# Patient Record
Sex: Female | Born: 1940 | Race: White | Hispanic: No | Smoking: Never smoker
Health system: Southern US, Community
[De-identification: ages and names within clinical notes are randomized; demographics above are authoritative.]

## PROBLEM LIST (undated history)

## (undated) DIAGNOSIS — F32A Depression, unspecified: Secondary | ICD-10-CM

## (undated) DIAGNOSIS — M1990 Unspecified osteoarthritis, unspecified site: Secondary | ICD-10-CM

## (undated) DIAGNOSIS — I10 Essential (primary) hypertension: Secondary | ICD-10-CM

## (undated) DIAGNOSIS — T7840XA Allergy, unspecified, initial encounter: Secondary | ICD-10-CM

## (undated) DIAGNOSIS — F329 Major depressive disorder, single episode, unspecified: Secondary | ICD-10-CM

## (undated) HISTORY — DX: Depression, unspecified: F32.A

## (undated) HISTORY — DX: Unspecified osteoarthritis, unspecified site: M19.90

## (undated) HISTORY — DX: Allergy, unspecified, initial encounter: T78.40XA

## (undated) HISTORY — DX: Essential (primary) hypertension: I10

## (undated) HISTORY — DX: Major depressive disorder, single episode, unspecified: F32.9

---

## 1999-07-25 ENCOUNTER — Encounter: Admission: RE | Admit: 1999-07-25 | Discharge: 1999-07-25 | Payer: Self-pay | Admitting: Emergency Medicine

## 1999-07-25 ENCOUNTER — Encounter: Payer: Self-pay | Admitting: Emergency Medicine

## 2000-07-26 ENCOUNTER — Encounter: Payer: Self-pay | Admitting: *Deleted

## 2000-07-26 ENCOUNTER — Encounter: Admission: RE | Admit: 2000-07-26 | Discharge: 2000-07-26 | Payer: Self-pay | Admitting: *Deleted

## 2000-12-11 ENCOUNTER — Encounter: Payer: Self-pay | Admitting: *Deleted

## 2000-12-11 ENCOUNTER — Encounter: Admission: RE | Admit: 2000-12-11 | Discharge: 2000-12-11 | Payer: Self-pay | Admitting: *Deleted

## 2001-07-30 ENCOUNTER — Encounter: Payer: Self-pay | Admitting: *Deleted

## 2001-07-30 ENCOUNTER — Encounter: Admission: RE | Admit: 2001-07-30 | Discharge: 2001-07-30 | Payer: Self-pay | Admitting: *Deleted

## 2001-11-29 ENCOUNTER — Other Ambulatory Visit: Admission: RE | Admit: 2001-11-29 | Discharge: 2001-11-29 | Payer: Self-pay | Admitting: *Deleted

## 2002-02-17 ENCOUNTER — Ambulatory Visit (HOSPITAL_COMMUNITY): Admission: RE | Admit: 2002-02-17 | Discharge: 2002-02-17 | Payer: Self-pay | Admitting: Gastroenterology

## 2002-08-14 ENCOUNTER — Encounter: Payer: Self-pay | Admitting: *Deleted

## 2002-08-14 ENCOUNTER — Encounter: Admission: RE | Admit: 2002-08-14 | Discharge: 2002-08-14 | Payer: Self-pay | Admitting: *Deleted

## 2002-12-03 ENCOUNTER — Other Ambulatory Visit: Admission: RE | Admit: 2002-12-03 | Discharge: 2002-12-03 | Payer: Self-pay | Admitting: *Deleted

## 2003-08-17 ENCOUNTER — Encounter: Admission: RE | Admit: 2003-08-17 | Discharge: 2003-08-17 | Payer: Self-pay | Admitting: *Deleted

## 2003-12-16 ENCOUNTER — Other Ambulatory Visit: Admission: RE | Admit: 2003-12-16 | Discharge: 2003-12-16 | Payer: Self-pay | Admitting: *Deleted

## 2005-01-16 ENCOUNTER — Encounter: Admission: RE | Admit: 2005-01-16 | Discharge: 2005-01-16 | Payer: Self-pay | Admitting: Emergency Medicine

## 2005-04-18 ENCOUNTER — Ambulatory Visit (HOSPITAL_BASED_OUTPATIENT_CLINIC_OR_DEPARTMENT_OTHER): Admission: RE | Admit: 2005-04-18 | Discharge: 2005-04-18 | Payer: Self-pay | Admitting: Orthopaedic Surgery

## 2005-04-18 ENCOUNTER — Ambulatory Visit (HOSPITAL_COMMUNITY): Admission: RE | Admit: 2005-04-18 | Discharge: 2005-04-18 | Payer: Self-pay | Admitting: Orthopaedic Surgery

## 2005-08-31 ENCOUNTER — Encounter: Admission: RE | Admit: 2005-08-31 | Discharge: 2005-08-31 | Payer: Self-pay | Admitting: Emergency Medicine

## 2006-08-28 ENCOUNTER — Encounter: Admission: RE | Admit: 2006-08-28 | Discharge: 2006-08-28 | Payer: Self-pay | Admitting: Emergency Medicine

## 2006-09-03 ENCOUNTER — Encounter: Admission: RE | Admit: 2006-09-03 | Discharge: 2006-09-03 | Payer: Self-pay | Admitting: Emergency Medicine

## 2007-10-16 ENCOUNTER — Encounter: Admission: RE | Admit: 2007-10-16 | Discharge: 2007-10-16 | Payer: Self-pay | Admitting: Emergency Medicine

## 2008-09-11 HISTORY — PX: REPLACEMENT TOTAL KNEE: SUR1224

## 2008-10-26 ENCOUNTER — Encounter: Admission: RE | Admit: 2008-10-26 | Discharge: 2008-10-26 | Payer: Self-pay | Admitting: Emergency Medicine

## 2009-02-11 ENCOUNTER — Inpatient Hospital Stay (HOSPITAL_COMMUNITY): Admission: RE | Admit: 2009-02-11 | Discharge: 2009-02-15 | Payer: Self-pay | Admitting: Orthopaedic Surgery

## 2009-10-27 ENCOUNTER — Encounter: Admission: RE | Admit: 2009-10-27 | Discharge: 2009-10-27 | Payer: Self-pay | Admitting: Emergency Medicine

## 2010-04-27 ENCOUNTER — Encounter: Admission: RE | Admit: 2010-04-27 | Discharge: 2010-04-27 | Payer: Self-pay | Admitting: Family Medicine

## 2010-08-11 ENCOUNTER — Ambulatory Visit: Payer: Self-pay | Admitting: Family Medicine

## 2010-08-11 DIAGNOSIS — I10 Essential (primary) hypertension: Secondary | ICD-10-CM | POA: Insufficient documentation

## 2010-10-01 ENCOUNTER — Other Ambulatory Visit: Payer: Self-pay | Admitting: Family Medicine

## 2010-10-01 DIAGNOSIS — Z1239 Encounter for other screening for malignant neoplasm of breast: Secondary | ICD-10-CM

## 2010-10-13 NOTE — Assessment & Plan Note (Signed)
Summary: COLD AND CONGESTION/EVM   Vital Signs:  Patient Profile:   70 Years Old Female CC:      Cold & URI symptoms Height:     61 inches Weight:      156 pounds BMI:     29.58 O2 Sat:      96 % O2 treatment:    Room Air Temp:     97.6 degrees F oral Pulse rate:   70 / minute Pulse rhythm:   regular Resp:     18 per minute BP sitting:   145 / 77  (right arm)  Pt. in pain?   no  Vitals Entered By: Levonne Spiller EMT-P (August 11, 2010 12:54 PM)              Is Patient Diabetic? No     Serial Vital Signs/Assessments:  Time      Position  BP       Pulse  Resp  Temp     By 1309                132/79   69                    Clanford Johnson MD   Current Allergies: No known allergies History of Present Illness History from: patient Chief Complaint: Cold & URI symptoms History of Present Illness: Pt presenting today because she is having at least 1.5 weeks of nasal congestion, productive cough, yellowish sputum, no wheezing, no SOB, no CP, no diarrhea reported; ears plugged up; She has had some rare sneezing.  She says that she is taking her blood pressure medications but does report that the dose was decreased by 1/2 recently.  She says that she has been taking Nyquil OTC at night.  She says that she does not have constipation, but has had some fatigue.   Pt says that she already had her flu vaccine.   REVIEW OF SYSTEMS Constitutional Symptoms       Complains of fatigue.     Denies fever, chills, night sweats, weight loss, and weight gain.  Eyes       Denies change in vision, eye pain, eye discharge, glasses, contact lenses, and eye surgery. Ear/Nose/Throat/Mouth       Complains of frequent runny nose, sinus problems, and hoarseness.      Denies hearing loss/aids, change in hearing, ear pain, ear discharge, dizziness, frequent nose bleeds, sore throat, and tooth pain or bleeding.  Respiratory       Complains of productive cough.      Denies dry cough, wheezing, shortness  of breath, asthma, bronchitis, and emphysema/COPD.      Comments: Colored Sputum Cardiovascular       Denies murmurs, chest pain, and tires easily with exhertion.    Gastrointestinal       Denies stomach pain, nausea/vomiting, diarrhea, constipation, blood in bowel movements, and indigestion. Genitourniary       Denies painful urination, kidney stones, and loss of urinary control. Neurological       Denies paralysis, seizures, and fainting/blackouts. Musculoskeletal       Denies muscle pain, joint pain, joint stiffness, decreased range of motion, redness, swelling, muscle weakness, and gout.  Skin       Denies bruising, unusual mles/lumps or sores, and hair/skin or nail changes.  Psych       Denies mood changes, temper/anger issues, anxiety/stress, speech problems, depression, and sleep problems.  Past History:  Past  Medical History: Hypertension Allergic Rhinitis - Seasonal   Past Surgical History: Pt denies  Family History: Heart Disease in both parents  Social History: Pt denies tobacco, alcohol and recreational drug use. Physical Exam General appearance: well developed, well nourished, no acute distress Head: normocephalic, atraumatic Eyes: conjunctivae and lids normal Pupils: equal, round, reactive to light Ears: normal, no lesions or deformities Nasal: thick yellowish drainage seen in left nares Oral/Pharynx: tongue normal, posterior pharynx without erythema or exudate Neck: neck supple,  trachea midline, no masses Thyroid: no nodules, masses, tenderness, or enlargement Chest/Lungs: no rales, wheezes, or rhonchi bilateral, breath sounds equal without effort Heart: regular rate and  rhythm, no murmur Abdomen: soft, non-tender without obvious organomegaly Extremities: normal extremities Neurological: grossly intact and non-focal Skin: no obvious rashes or lesions MSE: oriented to time, place, and person Assessment Problems:   New Problems: UNSPECIFIED ESSENTIAL  HYPERTENSION (ICD-401.9) ALLERGIC RHINITIS, SEASONAL, MILD (ICD-477.9) ACUTE SINUSITIS, UNSPECIFIED (ICD-461.9)   Patient Education: Patient and/or caregiver instructed in the following: rest, fluids, Tylenol prn. The risks, benefits and possible side effects were clearly explained and discussed with the patient.  The patient verbalized clear understanding.  The patient was given instructions to return if symptoms don't improve, worsen or new changes develop.  If it is not during clinic hours and the patient cannot get back to this clinic then the patient was told to seek medical care at an available urgent care or emergency department.  The patient verbalized understanding.   Demonstrates willingness to comply.  Plan New Medications/Changes: AMOXICILLIN 875 MG TABS (AMOXICILLIN) take 1 by mouth two times a day to treat sinus infection  #20 x 0, 08/11/2010, Standley Dakins MD  Follow Up: Follow up in 2-3 days if no improvement, Follow up on an as needed basis, Follow up with Primary Physician  The patient and/or caregiver has been counseled thoroughly with regard to medications prescribed including dosage, schedule, interactions, rationale for use, and possible side effects and they verbalize understanding.  Diagnoses and expected course of recovery discussed and will return if not improved as expected or if the condition worsens. Patient and/or caregiver verbalized understanding.  Prescriptions: AMOXICILLIN 875 MG TABS (AMOXICILLIN) take 1 by mouth two times a day to treat sinus infection  #20 x 0   Entered and Authorized by:   Standley Dakins MD   Signed by:   Standley Dakins MD on 08/11/2010   Method used:   Electronically to        [facility] (retail)       [address], [address]       [place], [place]  [address]       Ph: [phone]       Fax: [phone]   RxID:   [id number]   Patient Instructions: 1)  Take 650mg  of Tylenol  every 4-6 hours as needed for relief of pain or comfort of fever AVOID taking more than 4000mg   in a 24 hour period (can cause liver damage in higher doses). 2)  Take your antibiotic as prescribed until ALL of it is gone, but stop if you develop a rash or swelling and contact our office as soon as possible. 3)  Acute sinusitis symptoms for less than 10 days are not helped by antibiotics.Use warm moist compresses, and over the counter decongestants ( only as directed). Call if no improvement in 5-7 days, sooner if increasing pain, fever, or new symptoms.  4)  Oral Rehydration Solution: drink 1/2 ounce every 15 minutes. If tolerated afert 1 hour, drink 1 ounce every 15 minutes. As you can tolerate, keep adding 1/2 ounce every 15 minutes, up to a total of 2-4 ounces. Contact the office if unable to tolerate oral solution, if you keep vomiting, or you continue to have signs of dehydration. 5)  Go to your Primary provider and have your blood pressure rechecked in 1 week.  6)  Check your Blood Pressure regularly. If it is above 130/90 you should make an appointment.

## 2010-10-31 ENCOUNTER — Ambulatory Visit: Payer: Self-pay

## 2010-11-07 ENCOUNTER — Ambulatory Visit: Payer: Self-pay

## 2010-11-07 ENCOUNTER — Ambulatory Visit
Admission: RE | Admit: 2010-11-07 | Discharge: 2010-11-07 | Disposition: A | Discharge status: Home / Self Care | Payer: Medicare Other | Source: Ambulatory Visit | Attending: Family Medicine | Admitting: Family Medicine

## 2010-11-07 DIAGNOSIS — Z1239 Encounter for other screening for malignant neoplasm of breast: Secondary | ICD-10-CM

## 2010-12-19 LAB — PROTIME-INR
INR: 1.2 (ref 0.00–1.49)
INR: 1.9 — ABNORMAL HIGH (ref 0.00–1.49)
INR: 2.3 — ABNORMAL HIGH (ref 0.00–1.49)
Prothrombin Time: 23.3 seconds — ABNORMAL HIGH (ref 11.6–15.2)
Prothrombin Time: 26.9 seconds — ABNORMAL HIGH (ref 11.6–15.2)
Prothrombin Time: 32.1 seconds — ABNORMAL HIGH (ref 11.6–15.2)

## 2010-12-19 LAB — CBC
HCT: 28.9 % — ABNORMAL LOW (ref 36.0–46.0)
Hemoglobin: 10 g/dL — ABNORMAL LOW (ref 12.0–15.0)
Hemoglobin: 9.1 g/dL — ABNORMAL LOW (ref 12.0–15.0)
MCHC: 34.4 g/dL (ref 30.0–36.0)
MCHC: 34.6 g/dL (ref 30.0–36.0)
MCHC: 34.7 g/dL (ref 30.0–36.0)
MCV: 92.9 fL (ref 78.0–100.0)
Platelets: 173 10*3/uL (ref 150–400)
Platelets: 183 10*3/uL (ref 150–400)
RBC: 2.98 MIL/uL — ABNORMAL LOW (ref 3.87–5.11)
RBC: 3.09 MIL/uL — ABNORMAL LOW (ref 3.87–5.11)
RDW: 13 % (ref 11.5–15.5)
WBC: 4.9 10*3/uL (ref 4.0–10.5)

## 2010-12-19 LAB — BASIC METABOLIC PANEL
BUN: 10 mg/dL (ref 6–23)
BUN: 8 mg/dL (ref 6–23)
CO2: 31 mEq/L (ref 19–32)
CO2: 32 mEq/L (ref 19–32)
CO2: 34 mEq/L — ABNORMAL HIGH (ref 19–32)
Calcium: 8.5 mg/dL (ref 8.4–10.5)
Calcium: 8.8 mg/dL (ref 8.4–10.5)
Creatinine, Ser: 0.69 mg/dL (ref 0.4–1.2)
Creatinine, Ser: 0.73 mg/dL (ref 0.4–1.2)
GFR calc non Af Amer: >60 mL/min (ref >60)
Glucose, Bld: 100 mg/dL — ABNORMAL HIGH (ref 70–99)
Glucose, Bld: 122 mg/dL — ABNORMAL HIGH (ref 70–99)
Potassium: 3.5 mEq/L (ref 3.5–5.1)
Potassium: 3.9 mEq/L (ref 3.5–5.1)
Sodium: 135 mEq/L (ref 135–145)

## 2010-12-20 LAB — BASIC METABOLIC PANEL
BUN: 16 mg/dL (ref 6–23)
Chloride: 101 mEq/L (ref 96–112)
GFR calc Af Amer: >60 mL/min (ref >60)
Glucose, Bld: 97 mg/dL (ref 70–99)

## 2010-12-20 LAB — CBC
HCT: 37.3 % (ref 36.0–46.0)
Hemoglobin: 12.9 g/dL (ref 12.0–15.0)
MCHC: 34.7 g/dL (ref 30.0–36.0)
Platelets: 210 10*3/uL (ref 150–400)
RDW: 12.6 % (ref 11.5–15.5)

## 2011-01-24 NOTE — Discharge Summary (Signed)
Monica Fuller, Monica Fuller                 ACCOUNT NO.:  [id number]   MEDICAL RECORD NO.:  [id number]          PATIENT TYPE:  INP   LOCATION:  5011                         FACILITY:  [facility]   PHYSICIAN:  Lindwood Qua, P.A. DATE OF BIRTH:  1941-08-15   DATE OF ADMISSION:  02/11/2009  DATE OF DISCHARGE:  02/15/2009                               DISCHARGE SUMMARY   ADMITTING DIAGNOSES:  1. Left knee end-stage degenerative joint disease.  2. Hypertension.  3. Depression.   DISCHARGE DIAGNOSES:  1. Left knee end-stage  degenerative joint disease.  2. Hypertension.  3. Depression.   OPERATIONS:  Left total knee replacement.   BRIEF HISTORY:  Ms. Aguirre is a 70 year old white female patient well-  known to our practice who has had increasing left knee pain and on x-  ray, has end-stage DJD, medial and patellofemoral compartment.  We  discussed treatment options that being total knee replacement.   PERTINENT LABORATORY AND X-RAY FINDINGS:  Hemoglobin last testing 9.1,  hematocrit 26.3, WBC is 4.5, platelets 183.  Sodium 135, potassium 3.9,  BUN 8, glucose 122, creatinine 0.72.  Last INR 1.9.   COURSE IN THE HOSPITAL:  Ms. Steury was admitted postoperatively.  Kept  on her home medicines which will be outlined at the end of this  dictation, Lovenox and Coumadin, protocol for DVT prophylaxis per  pharmacy, IV Ancef 1 g q.8 h. x3 doses and then appropriate pain  medicine, given antiemetics, antispasmodics, knee-high TEDs, incentive  spirometry, and then activity, weightbearing as tolerated with physical  therapy were ordered.  The first day postop, she was using her CPM.  Foley catheter was in place.  Blood pressure was 90/52.  Temperature was  99.  Lungs were clear.  Abdomen was soft.  Catheter was removed.  She  was able to void without difficulty.  Second day postop, dressing was  changed and drain was pulled.  Wound was noted to be benign.  No sign of  infection or irritation.  Lungs  continued to be clear throughout her  hospital stay without any difficulties.  The patient eventually had  small bowel movement on postoperative days, was able to void well.  Due  to her family situation and home life, she had no one at home to care  for her and it was necessary to discharge to the skilled nursing  facility for short term.   CONDITION ON DISCHARGE:  Improved.   FOLLOWUP:  She will remain on a low-sodium, heart-healthy diet.  May  change her dressing daily.  Weightbearing as tolerated.  Physical  therapy for range of motion, and may also incorporate a CPM.  Knee brace  only as needed.  She would be on Coumadin with an INR between 2-3 for 2  weeks postoperatively regulated by pharmacy.  Continue on Percocet 1 or  2 every 4-6 hours p.r.n. pain.  Coumadin as prescribed.  Continue on her  home medicines which  would be calcium 600 mg with vitamin D 1 or 2 per day and triamterene  and hydrochlorothiazide 37.5/25 1 a day, sertraline 100 mg  1 a day.  If  any sign of infection, to call our office at [phone] immediately,  otherwise we will see her back in the office in 2 weeks from the  surgical day so that staples can be removed.      Lindwood Qua, P.A.     MC/MEDQ  D:  02/14/2009  T:  02/15/2009  Job:  [id number]

## 2011-01-24 NOTE — Op Note (Signed)
Monica Fuller, BRESEE                 ACCOUNT NO.:  [id number]   MEDICAL RECORD NO.:  [id number]          PATIENT TYPE:  INP   LOCATION:  5011                         FACILITY:  [facility]   PHYSICIAN:  Lubertha Basque. Dalldorf, M.D.DATE OF BIRTH:  [birth year]   DATE OF PROCEDURE:  02/11/2009  DATE OF DISCHARGE:                               OPERATIVE REPORT   PREOPERATIVE DIAGNOSIS:  Left knee degenerative joint disease.   POSTOPERATIVE DIAGNOSIS:  Left knee degenerative joint disease.   PROCEDURE:  Left total knee replacement.   ANESTHESIA:  General and block.   ATTENDING SURGEON:  Lubertha Basque. Jerl Santos, MD   ASSISTANT:  Lindwood Qua, PA   INDICATIONS FOR PROCEDURE:  The patient is a 70 year old woman with a  long history of left knee pain.  She has had various oral anti-  inflammatories and multiple injections.  She had an arthroscopy about 5  years back.  She has pain at this point which is limiting to her in that  she cannot rest or walk without difficulty.  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  She is offered a  knee replacement.  Informed operative consent was obtained after  discussion of possible complications including reaction to anesthesia,  infection, DVT, PE, and death.   SUMMARY, FINDINGS, AND PROCEDURE:  Under general anesthesia and a block,  a left knee replacement was performed.  She had advanced degenerative  change in medial and patellofemoral compartments.  She had good bone  quality.  I addressed her problem with a cemented DePuy LCS system using  a standard plus femur, 4 tray, 38 all polyethylene patella, and 12.5  deep dish spacer.  Lindwood Qua assisted throughout and was  invaluable to the completion of the case in that he helped position and  retract while I performed the procedure.  He also closed simultaneously  to help minimize OR time.   DESCRIPTION OF PROCEDURE:  The patient was taken to the operating suite  where  general anesthetic was applied without difficulty.  She was also  given a block in the preanesthesia area.  She was positioned supine and  prepped and draped in a normal sterile fashion.  After administration of  IV Kefzol, the left leg was elevated, exsanguinated, tourniquet was  inflated about the thigh.  A longitudinal anterior incision was made  with dissection down the extensor mechanism.  A medial parapatellar  incision was made and the kneecap was flipped and the knee flexed.  All  appropriate antiinfective measures were used including closed hooded  exhaust systems for each member of the surgical team, Betadine-  impregnated drape, and the aforementioned IV antibiotic.  Some residual  meniscal tissues were removed along the ACL and PCL.  An extramedullary  guide was placed on the tibia to make cut with a slight posterior tilt.  An intramedullary guide was then placed in the femur to make anterior  and posterior cuts creating a flexion gap of 12.5 mm.  A second  intramedullary guide was placed in the femur to make a  distal cut  creating an equal extension gap of 12.5 mm balancing the knee.  Minimal  soft tissue release was required off the tibia to balance the knee.  The  femur sized to a standard plus and the tibia to a 4 with the appropriate  guides placed and utilized.  The patella was cut down to thickness by 10  mm and sized to a 38 with the appropriate guide placed and utilized.  Trial reduction was done with these components along with the 12.5  spacer.  She hyperextended slightly and flexed well and the patella  tracked well.  Trial components were removed followed by pulsatile  lavage and irrigation of all 3 cut bony surfaces.  Cement was mixed  including Zinacef antibiotic and was pressurized onto the bones followed  by placement of the aforementioned DePuy LCS components.  Excess cement  was trimmed and pressure was held on the components until the cement  hardened.  The  tourniquet was deflated and a small amount of bleeding  was easily controlled with Bovie cautery.  The wound was irrigated  followed by reapproximation of deep tissues with #1 Vicryl over a drain.  Subcutaneous tissues were reapproximated 0 and 2-0 undyed Vicryl and  skin was closed with staples.  Adaptic was applied followed by dry gauze  and loose Ace wrap.  Estimated blood loss and intraoperative fluids can  be obtained from anesthesia records as can accurate tourniquet time.   DISPOSITION:  The patient was extubated in the operating room and taken  to the recovery room in stable addition.  She was admitted to Orthopedic  Surgery Service for appropriate postop care to include perioperative  antibiotics and Coumadin plus Lovenox for DVT prophylaxis.      Lubertha Basque Jerl Santos, M.D.  Electronically Signed     PGD/MEDQ  D:  02/11/2009  T:  02/11/2009  Job:  [id number]

## 2011-01-27 NOTE — Op Note (Signed)
NAMEHILMA, Monica Fuller                 ACCOUNT NO.:  [id number]   MEDICAL RECORD NO.:  [id number]          PATIENT TYPE:  AMB   LOCATION:  DSC                          FACILITY:  [facility]   PHYSICIAN:  Lubertha Basque. Dalldorf, M.D.DATE OF BIRTH:  1941/03/06   DATE OF PROCEDURE:  04/18/2005  DATE OF DISCHARGE:                                 OPERATIVE REPORT   PREOPERATIVE DIAGNOSES:  1.  Left knee torn medial meniscus.  2.  Left knee DJD.   POSTOPERATIVE DIAGNOSES:  1.  Left knee torn medial meniscus.  2.  Left knee DJD.   PROCEDURES:  1.  Left knee arthroscopic partial medial meniscectomy.  2.  Left knee arthroscopic abrasion chondroplasty.   ANESTHESIA:  General.   ATTENDING SURGEON:  Lubertha Basque. Dalldorf, M.D.   ASSISTANTHarolyn Rutherford, PA   INDICATIONS FOR PROCEDURE:  The patient is a 70 year old woman with five  months of intense medial aspect left knee pain after twisting her knee while  shoveling in the garden.  She has persisted with pain despite oral anti-  inflammatories and an injection, which did afford her a couple of days of  relief. She has undergone an MRI scan which shows a displaceable complex  medial meniscus tear and some degenerative changes. With continued pain at  rest and pain which limits her activities, she is offered an arthroscopy.  Informed operative consent was obtained after discussion of possible  complications of reaction to anesthesia and infection.   DESCRIPTION OF PROCEDURE:  The patient was taken to operating suite where  general anesthetic was applied without difficulty. She was positioned supine  and prepped and draped in normal sterile fashion. After administration of  preop IV antibiotics, arthroscopy of left knee was performed through two  inferior portals. Suprapatellar pouch was benign while patellofemoral joint  exhibited some grade 3 change addressed with a thorough chondroplasty back  to stable tissues. The patella did track fairly well. The  medial compartment  exhibited a degenerative tear of the posterior horn of the meniscus. She had  some displaceable fragments. About a 20% partial medial meniscectomy was  done back to stable tissues. She did have some grade 3 change across broad  areas of the medial femoral condyle and some small areas of grade 4 change  addressed with brief abrasion to bleeding bone. The ACL appeared intact. The  lateral compartment exhibited no evidence of meniscal or articular cartilage  injury that needed to be addressed. The knee was thoroughly irrigated  followed by placement of Marcaine with epinephrine and morphine plus some  Depo-Medrol. Adaptic was placed over her portals followed by dry gauze and  loose Ace wrap. Estimated blood loss, intraoperative fluids can be obtained  from anesthesia records.   DISPOSITION:  The patient was extubated in the operating room, taken to  recovery room, stable condition. Plans were for her to go home the same day  and found up in the office in less than a week.  I will contact by phone  tonight.      Lubertha Basque Jerl Santos, M.D.  Electronically Signed  PGD/MEDQ  D:  04/18/2005  T:  04/18/2005  Job:  [id number]

## 2011-01-27 NOTE — Procedures (Signed)
[facility]  Patient:    Monica Fuller, COLLETT Visit Number: [id number] MRN: [id number]          Service Type: END Location: ENDO Attending Physician:  Louie Bun Dictated by:   Everardo All Madilyn Fireman, M.D. Proc. Date: 02/17/02 Admit Date:  02/17/2002   CC:         Oley Balm. Georgina Pillion, M.D.   Procedure Report  PROCEDURE:  Colonoscopy.  INDICATION FOR PROCEDURE:  Occasional rectal bleeding in a 70 year old patient with no prior colon screening.  DESCRIPTION OF PROCEDURE:  The patient was placed in the left lateral decubitus position and placed on the pulse monitor with continuous low-flow oxygen delivered by nasal cannula.  She was sedated with 50 mg of IV Demerol and 5 mg of IV Versed.  The Olympus video colonoscope was inserted into the rectum and advanced to the cecum, confirmed by transillumination at McBurneys point and visualization of the ileocecal valve and appendiceal orifice.  The prep was excellent.  The cecum, ascending, transverse, descending, and sigmoid colon all appeared normal with no masses, polyps, diverticula or other mucosal abnormalities.  The rectum likewise appeared normal, and retroflexed view of the anus revealed small internal hemorrhoids with no stigma of hemorrhage. The colonoscope was then withdrawn, and the patient returned to the recovery room in stable condition.  She tolerated the procedure well, and there were no immediate complications.  IMPRESSION: 1. Small internal hemorrhoids. 2. Otherwise normal colonoscopy.  PLAN: 1. Symptomatic treatment of hemorrhoids. 2, Flexible sigmoidoscopy in five years. Dictated by:   Everardo All Madilyn Fireman, M.D. Attending Physician:  Louie Bun DD:  02/17/02 TD:  02/18/02 Job: [id number]

## 2011-04-05 ENCOUNTER — Encounter: Payer: Self-pay | Admitting: Anesthesiology

## 2011-04-05 ENCOUNTER — Other Ambulatory Visit (HOSPITAL_COMMUNITY)
Admission: RE | Admit: 2011-04-05 | Discharge: 2011-04-05 | Disposition: A | Discharge status: Home / Self Care | Payer: Medicare Other | Source: Ambulatory Visit | Attending: Gynecology | Admitting: Gynecology

## 2011-04-05 ENCOUNTER — Ambulatory Visit (INDEPENDENT_AMBULATORY_CARE_PROVIDER_SITE_OTHER): Payer: Medicare Other | Admitting: Gynecology

## 2011-04-05 VITALS — BP 124/70 | Ht 60.5 in | Wt 150.0 lb

## 2011-04-05 DIAGNOSIS — F419 Anxiety disorder, unspecified: Secondary | ICD-10-CM | POA: Insufficient documentation

## 2011-04-05 DIAGNOSIS — I10 Essential (primary) hypertension: Secondary | ICD-10-CM | POA: Insufficient documentation

## 2011-04-05 DIAGNOSIS — Z01419 Encounter for gynecological examination (general) (routine) without abnormal findings: Secondary | ICD-10-CM

## 2011-04-05 DIAGNOSIS — Z1211 Encounter for screening for malignant neoplasm of colon: Secondary | ICD-10-CM

## 2011-04-05 DIAGNOSIS — N924 Excessive bleeding in the premenopausal period: Secondary | ICD-10-CM

## 2011-04-05 DIAGNOSIS — Z124 Encounter for screening for malignant neoplasm of cervix: Secondary | ICD-10-CM

## 2011-04-05 DIAGNOSIS — K625 Hemorrhage of anus and rectum: Secondary | ICD-10-CM

## 2011-04-05 DIAGNOSIS — F32A Depression, unspecified: Secondary | ICD-10-CM | POA: Insufficient documentation

## 2011-04-05 NOTE — Progress Notes (Addendum)
Subjective:     Patient ID: Monica Fuller, female   DOB: 07-14-1941, 70 y.o.   MRN: [id number]  HPI 70 year old patient presented to the office today with concerns of whether she was having vaginal bleeding or not. When further questioning she stated that her bleeding was noted after she had a bowel movement and it's periodically. She is on no hormone replacement therapy. Her family doctor is Dr. Mechele Collin and she had all her lab work done last year. She is scheduled to have all her lab work next week. He is monitoring her hypertension and her depression. She is taking calcium with vitamin D and the above-mentioned medications for hypertension. Patient is not sexually active. Her last mammogram was November 2011. She frequently does her suffers examination. Her colonoscopy was 5-6 years ago which was normal. Her last bone density study was 2 years ago.   Review of Systems  Constitutional: Negative.   HENT: Negative.   Eyes: Negative.   Respiratory: Negative.   Cardiovascular: Negative.   Gastrointestinal: Positive for blood in stool.  Genitourinary: Positive for vaginal bleeding.  Musculoskeletal: Negative.   Skin: Negative.   Neurological: Negative.   Hematological: Negative.   Psychiatric/Behavioral: Negative.        Objective:   Physical Exam  Constitutional: She is oriented to person, place, and time. She appears well-developed and well-nourished.  HENT:  Head: Normocephalic and atraumatic.  Eyes: Conjunctivae and EOM are normal. Pupils are equal, round, and reactive to light.  Neck: Normal range of motion. Neck supple.  Cardiovascular: Normal rate and regular rhythm.   Pulmonary/Chest: Effort normal and breath sounds normal.  Abdominal: Soft. Bowel sounds are normal.  Genitourinary:       Pelvic exam: Bartholin urethra and Skene glands within normal limits Vagina: Atrophic changes cervix: Stenotic Uterus axial in position Adnexa: No palpable masses or tenderness  Rectal exam  unremarkable  Musculoskeletal: Normal range of motion.  Neurological: She is alert and oriented to person, place, and time.  Skin: Skin is warm and Fuller.       Assessment:     Patient unsure if bleeding was from the vagina or the rectum. After further questioning she noted the blood when she wipes. No abnormality noted the vagina. We attempted to do an endometrial biopsy in the office. Her cervix was stenotic and we attempted to dilate her cervix but creating much discomfort. We aborted the procedure. Fecal occult blood testing was done today. Patient is to call our office to let her let us know who her gastroenterologist is so that she can followup with him for possible colonoscopy and evaluation. Nevertheless patient returned back to the office next week for ultrasound to determine endometrial stripe. There was no evidence of any hemorrhoids or fissures or in the rectal region.    Plan:     We will plan on seeing her next week to evaluate the ultrasound and endometrial stripe. We'll wait for the recommendation to gastroenterologist doctor he proceed with colonoscopy. Patient also will need to schedule at the [facility] a bone density studies and she is overdue. We'll also n the upcoming visit the result of the thecal colpoplasty as well. No additional blood work was drawn today.

## 2011-04-13 ENCOUNTER — Ambulatory Visit (INDEPENDENT_AMBULATORY_CARE_PROVIDER_SITE_OTHER): Payer: Medicare Other | Admitting: Gynecology

## 2011-04-13 ENCOUNTER — Other Ambulatory Visit: Payer: Medicare Other

## 2011-04-13 ENCOUNTER — Other Ambulatory Visit: Payer: Self-pay

## 2011-04-13 DIAGNOSIS — N83339 Acquired atrophy of ovary and fallopian tube, unspecified side: Secondary | ICD-10-CM

## 2011-04-13 DIAGNOSIS — N950 Postmenopausal bleeding: Secondary | ICD-10-CM

## 2011-04-13 DIAGNOSIS — R141 Gas pain: Secondary | ICD-10-CM

## 2011-04-13 DIAGNOSIS — R350 Frequency of micturition: Secondary | ICD-10-CM

## 2011-04-13 DIAGNOSIS — R143 Flatulence: Secondary | ICD-10-CM

## 2011-04-13 DIAGNOSIS — R142 Eructation: Secondary | ICD-10-CM

## 2011-04-13 DIAGNOSIS — IMO0001 Reserved for inherently not codable concepts without codable children: Secondary | ICD-10-CM

## 2011-04-13 NOTE — Patient Instructions (Signed)
Please remember to make an appointment with Dr. Madilyn Fireman your gastroenterologist for follow up. Your mammogram is due at the end of the year. Remember to take calcium with vitamin D twice a day.

## 2011-04-13 NOTE — Progress Notes (Signed)
Patient returned to the office today for followup she was seen last week was a certain if her bleeding was per rectum her vagina we attempted to do an endometrial biopsy but due to her stenotic cervical os we were not successful I have asked her to come to the office today for an ultrasound to determine endometrial stripe thickness and to assess her ovaries. I was happy to inform her that her endometrial stripe was 2.6 mm (5 mm again the cut off for a postmenopausal patient). Her uterus measures 6.8 x 3.2 x 1.6 cm and the left ovary was not seen the right ovary was normal. Her urinalysis today was negative she complained of time to urinary frequency. Her last colonoscopy was in the year 2003 by Dr. Madilyn Fireman and I've asked her to make an appointment to followup with him. Her Hemoccult testing here in the office was negative. And her recent Pap smear was normal as well. She is scheduled for mammogram at the end of this year as well and her bone density study is up to date.

## 2011-05-29 ENCOUNTER — Encounter: Payer: Self-pay | Admitting: Gynecology

## 2011-10-04 ENCOUNTER — Other Ambulatory Visit: Payer: Self-pay | Admitting: Family Medicine

## 2011-10-04 DIAGNOSIS — Z1231 Encounter for screening mammogram for malignant neoplasm of breast: Secondary | ICD-10-CM

## 2011-11-09 ENCOUNTER — Ambulatory Visit: Payer: Medicare Other

## 2011-11-09 ENCOUNTER — Ambulatory Visit
Admission: RE | Admit: 2011-11-09 | Discharge: 2011-11-09 | Disposition: A | Discharge status: Home / Self Care | Payer: Medicare Other | Source: Ambulatory Visit | Attending: Family Medicine | Admitting: Family Medicine

## 2011-11-09 DIAGNOSIS — Z1231 Encounter for screening mammogram for malignant neoplasm of breast: Secondary | ICD-10-CM

## 2012-03-26 ENCOUNTER — Other Ambulatory Visit: Payer: Self-pay | Admitting: Gastroenterology

## 2012-03-26 LAB — HM COLONOSCOPY

## 2012-04-02 ENCOUNTER — Other Ambulatory Visit: Payer: Self-pay | Admitting: Dermatology

## 2012-06-25 LAB — HM DEXA SCAN

## 2012-10-07 ENCOUNTER — Other Ambulatory Visit: Payer: Self-pay | Admitting: Family Medicine

## 2012-10-07 DIAGNOSIS — Z1231 Encounter for screening mammogram for malignant neoplasm of breast: Secondary | ICD-10-CM

## 2012-11-11 ENCOUNTER — Ambulatory Visit: Payer: Medicare Other

## 2012-11-11 ENCOUNTER — Ambulatory Visit
Admission: RE | Admit: 2012-11-11 | Discharge: 2012-11-11 | Disposition: A | Discharge status: Home / Self Care | Payer: Medicare Other | Source: Ambulatory Visit | Attending: Family Medicine | Admitting: Family Medicine

## 2012-11-11 DIAGNOSIS — Z1231 Encounter for screening mammogram for malignant neoplasm of breast: Secondary | ICD-10-CM

## 2012-11-11 LAB — HM MAMMOGRAPHY

## 2013-01-13 ENCOUNTER — Ambulatory Visit (INDEPENDENT_AMBULATORY_CARE_PROVIDER_SITE_OTHER): Payer: Medicare Other | Admitting: Gynecology

## 2013-01-13 ENCOUNTER — Encounter: Payer: Self-pay | Admitting: Gynecology

## 2013-01-13 VITALS — BP 146/82 | Ht 61.0 in | Wt 156.0 lb

## 2013-01-13 DIAGNOSIS — M8580 Other specified disorders of bone density and structure, unspecified site: Secondary | ICD-10-CM | POA: Insufficient documentation

## 2013-01-13 DIAGNOSIS — M810 Age-related osteoporosis without current pathological fracture: Secondary | ICD-10-CM

## 2013-01-13 DIAGNOSIS — N952 Postmenopausal atrophic vaginitis: Secondary | ICD-10-CM | POA: Insufficient documentation

## 2013-01-13 DIAGNOSIS — Z780 Asymptomatic menopausal state: Secondary | ICD-10-CM | POA: Insufficient documentation

## 2013-01-13 NOTE — Patient Instructions (Addendum)
Exercise to Lose Weight Exercise and a healthy diet may help you lose weight. Your doctor may suggest specific exercises. EXERCISE IDEAS AND TIPS  Choose low-cost things you enjoy doing, such as walking, bicycling, or exercising to workout videos.   Take stairs instead of the elevator.   Walk during your lunch break.   Park your car further away from work or school.   Go to a gym or an exercise class.   Start with 5 to 10 minutes of exercise each day. Build up to 30 minutes of exercise 4 to 6 days a week.   Wear shoes with good support and comfortable clothes.   Stretch before and after working out.   Work out until you breathe harder and your heart beats faster.   Drink extra water when you exercise.   Do not do so much that you hurt yourself, feel dizzy, or get very short of breath.  Exercises that burn about 150 calories:  Running 1  miles in 15 minutes.   Playing volleyball for 45 to 60 minutes.   Washing and waxing a car for 45 to 60 minutes.   Playing touch football for 45 minutes.   Walking 1  miles in 35 minutes.   Pushing a stroller 1  miles in 30 minutes.   Playing basketball for 30 minutes.   Raking leaves for 30 minutes.   Bicycling 5 miles in 30 minutes.   Walking 2 miles in 30 minutes.   Dancing for 30 minutes.   Shoveling snow for 15 minutes.   Swimming laps for 20 minutes.   Walking up stairs for 15 minutes.   Bicycling 4 miles in 15 minutes.   Gardening for 30 to 45 minutes.   Jumping rope for 15 minutes.   Washing windows or floors for 45 to 60 minutes.  Document Released: 09/30/2010 Document Revised: 05/10/2011 Document Reviewed: 09/30/2010 [facility] Patient Information 2012 [place], [place].    Bone Densitometry Bone densitometry is a special X-ray that measures your bone density and can be used to help predict your risk of bone fractures. This test is used to determine bone mineral content and density to diagnose osteoporosis.  Osteoporosis is the loss of bone that may cause the bone to become weak. Osteoporosis commonly occurs in women entering menopause. However, it may be found in men and in people with other diseases. PREPARATION FOR TEST No preparation necessary. WHO SHOULD BE TESTED?  All women older than 53.  Postmenopausal women (50 to 75) with risk factors for osteoporosis.  People with a previous fracture caused by normal activities.  People with a small body frame (less than 127 poundsor a body mass index [BMI] of less than 21).  People who have a parent with a hip fracture or history of osteoporosis.  People who smoke.  People who have rheumatoid arthritis.  Anyone who engages in excessive alcohol use (more than 3 drinks most days).  Women who experience early menopause. WHEN SHOULD YOU BE RETESTED? Current guidelines suggest that you should wait at least 2 years before doing a bone density test again if your first test was normal.Recent studies indicated that women with normal bone density may be able to wait a few years before needing to repeat a bone density test. You should discuss this with your caregiver.  NORMAL FINDINGS   Normal: less than standard deviation below normal (greater than -1).  Osteopenia: 1 to 2.5 standard deviations below normal (-1 to -2.5).  Osteoporosis: greater than  2.5 standard deviations below normal (less than -2.5). Test results are reported as a "T score" and a "Z score."The T score is a number that compares your bone density with the bone density of healthy, young women.The Z score is a number that compares your bone density with the scores of women who are the same age, gender, and race.  Ranges for normal findings may vary among different laboratories and hospitals. You should always check with your doctor after having lab work or other tests done to discuss the meaning of your test results and whether your values are considered within normal  limits. MEANING OF TEST  Your caregiver will go over the test results with you and discuss the importance and meaning of your results, as well as treatment options and the need for additional tests if necessary. OBTAINING THE TEST RESULTS It is your responsibility to obtain your test results. Ask the lab or department performing the test when and how you will get your results. Document Released: 09/19/2004 Document Revised: 11/20/2011 Document Reviewed: 10/12/2010 [facility] Patient Information 2013 [place], [place].  Osteoporosis Throughout your life, your body breaks down old bone and replaces it with new bone. As you get older, your body does not replace bone as quickly as it breaks it down. By the age of 30 years, most people begin to gradually lose bone because of the imbalance between bone loss and replacement. Some people lose more bone than others. Bone loss beyond a specified normal degree is considered osteoporosis.  Osteoporosis affects the strength and durability of your bones. The inside of the ends of your bones and your flat bones, like the bones of your pelvis, look like honeycomb, filled with tiny open spaces. As bone loss occurs, your bones become less dense. This means that the open spaces inside your bones become bigger and the walls between these spaces become thinner. This makes your bones weaker. Bones of a person with osteoporosis can become so weak that they can break (fracture) during minor accidents, such as a simple fall. CAUSES  The following factors have been associated with the development of osteoporosis:  Smoking.  Drinking more than 2 alcoholic drinks several days per week.  Long-term use of certain medicines:  Corticosteroids.  Chemotherapy medicines.  Thyroid medicines.  Antiepileptic medicines.  Gonadal hormone suppression medicine.  Immunosuppression medicine.  Being underweight.  Lack of physical activity.  Lack of exposure to the sun. This can  lead to vitamin D deficiency.  Certain medical conditions:  Certain inflammatory bowel diseases, such as Crohn's disease and ulcerative colitis.  Diabetes.  Hyperthyroidism.  Hyperparathyroidism. RISK FACTORS Anyone can develop osteoporosis. However, the following factors can increase your risk of developing osteoporosis:  Gender Women are at higher risk than men.  Age Being older than 50 years increases your risk.  Ethnicity White and Asian people have an increased risk.  Weight Being extremely underweight can increase your risk of osteoporosis.  Family history of osteoporosis Having a family member who has developed osteoporosis can increase your risk. SYMPTOMS  Usually, people with osteoporosis have no symptoms.  DIAGNOSIS  Signs during a physical exam that may prompt your caregiver to suspect osteoporosis include:  Decreased height. This is usually caused by the compression of the bones that form your spine (vertebrae) because they have weakened and become fractured.  A curving or rounding of the upper back (kyphosis). To confirm signs of osteoporosis, your caregiver may request a procedure that uses 2 low-dose X-ray beams with  different levels of energy to measure your bone mineral density (dual-energy X-ray absorptiometry [DXA]). Also, your caregiver may check your level of vitamin D. TREATMENT  The goal of osteoporosis treatment is to strengthen bones in order to decrease the risk of bone fractures. There are different types of medicines available to help achieve this goal. Some of these medicines work by slowing the processes of bone loss. Some medicines work by increasing bone density. Treatment also involves making sure that your levels of calcium and vitamin D are adequate. PREVENTION  There are things you can do to help prevent osteoporosis. Adequate intake of calcium and vitamin D can help you achieve optimal bone mineral density. Regular exercise can also help,  especially resistance and high-impact activities. If you smoke, quitting smoking is an important part of osteoporosis prevention. MAKE SURE YOU:  Understand these instructions.  Will watch your condition.  Will get help right away if you are not doing well or get worse. Document Released: 06/07/2005 Document Revised: 11/20/2011 Document Reviewed: 08/12/2011 [facility] Patient Information 2013 [place], [place].

## 2013-01-13 NOTE — Progress Notes (Signed)
Monica Fuller 1941-06-18 [id number]   History:    72 y.o.  Who has not been seen the office for over 2 years. Patient with known history of osteoporosis. She was previously been treated by another provider. She stated that she had been on Fosamax for about 15 years and had discontinued over 5 years ago. She is taking calcium and vitamin D twice a day. Patients  Bone density study back in 2011 had the lowest T score at the left femoral neck with a value of -0.5.She does work out her [employer] on a regular basis. She had a colonoscopy in 2013 the demonstrated benign polyp. When she was last seen there was a question whether her bleeding was coming from the vagina or the rectum but we could not determine until she had a colonoscopy and this was the source from her colon polyp. She did have an ultrasound which demonstrated she had a very thin endometrial stripe and atrophic ovaries. Patient denies any prior history of any abnormal Pap smears. No further bleeding reported. Patient states that her shingles and Pneumovax vaccine are up-to-date. Her primary physician is doing her lab work and monitoring her and treating her for hypertension and depression.   Past medical history,surgical history, family history and social history were all reviewed and documented in the EPIC chart.  Gynecologic History No LMP recorded. Patient is postmenopausal. Contraception: post menopausal status Last Pap: 2012. Results were: normal Last mammogram: 2014. Results were: normal  Obstetric History OB History   Grav Para Term Preterm Abortions TAB SAB Ect Mult Living   0                ROS: A ROS was performed and pertinent positives and negatives are included in the history.  GENERAL: No fevers or chills. HEENT: No change in vision, no earache, sore throat or sinus congestion. NECK: No pain or stiffness. CARDIOVASCULAR: No chest pain or pressure. No palpitations. PULMONARY: No shortness of breath, cough or wheeze.  GASTROINTESTINAL: No abdominal pain, nausea, vomiting or diarrhea, melena or bright red blood per rectum. GENITOURINARY: No urinary frequency, urgency, hesitancy or dysuria. MUSCULOSKELETAL: No joint or muscle pain, no back pain, no recent trauma. DERMATOLOGIC: No rash, no itching, no lesions. ENDOCRINE: No polyuria, polydipsia, no heat or cold intolerance. No recent change in weight. HEMATOLOGICAL: No anemia or easy bruising or bleeding. NEUROLOGIC: No headache, seizures, numbness, tingling or weakness. PSYCHIATRIC: No depression, no loss of interest in normal activity or change in sleep pattern.     Exam: chaperone present  BP 146/82  Ht 5\' 1"  (1.549 m)  Wt 156 lb (70.761 kg)  BMI 29.49 kg/m2  Body mass index is 29.49 kg/(m^2).  General appearance : Well developed well nourished female. No acute distress HEENT: Neck supple, trachea midline, no carotid bruits, no thyroidmegaly Lungs: Clear to auscultation, no rhonchi or wheezes, or rib retractions  Heart: Regular rate and rhythm, no murmurs or gallops Breast:Examined in sitting and supine position were symmetrical in appearance, no palpable masses or tenderness,  no skin retraction, no nipple inversion, no nipple discharge, no skin discoloration, no axillary or supraclavicular lymphadenopathy Abdomen: no palpable masses or tenderness, no rebound or guarding Extremities: no edema or skin discoloration or tenderness  Pelvic:  Bartholin, Urethra, Skene Glands: Within normal limits             Vagina: No gross lesions or discharge,atrophic changes  Cervix: No gross lesions or discharge  Uterus  axial, normal  size, shape and consistency, non-tender and mobile  Adnexa  Without masses or tenderness  Anus and perineum  normal   Rectovaginal  normal sphincter tone without palpated masses or tenderness             Hemoccult cards provided     Assessment/Plan:  72 y.o. female who is long overdue for bone density study with past history of  osteoporosis and prior intake of Fosamax. She was given a requisition. We discussed treatments for vaginal atrophy but she states that she is doing fine she does not eating anything. We discussed importance of monthly self breast examination. No Pap smear was done today. Patient with no known past history of abnormal Pap smears. New Pap smear guidelines discussed. We discussed importance of regular exercise on a calcium vitamin D for bone health. Hemoccult cards were provided her to symmetrical the office. Because her past history of osteoporosis we are going to check her vitamin D level today. Patient's primary physician will be drawn her lab work.    Ok Edwards MD, 2:38 PM 01/13/2013

## 2013-05-23 LAB — LIPID PANEL
Cholesterol: 237 mg/dL — AB (ref 0–200)
HDL: 70 mg/dL (ref 35–70)
LDl/HDL Ratio: 3.4
TRIGLYCERIDES: 108 mg/dL (ref 40–160)

## 2013-09-29 ENCOUNTER — Encounter: Payer: Self-pay | Admitting: *Deleted

## 2013-09-29 ENCOUNTER — Encounter: Payer: Self-pay | Admitting: Internal Medicine

## 2013-09-29 ENCOUNTER — Ambulatory Visit (INDEPENDENT_AMBULATORY_CARE_PROVIDER_SITE_OTHER): Payer: Medicare HMO | Admitting: Internal Medicine

## 2013-09-29 VITALS — BP 128/78 | HR 60 | Temp 97.7°F | Ht 62.25 in | Wt 158.5 lb

## 2013-09-29 DIAGNOSIS — F32A Depression, unspecified: Secondary | ICD-10-CM

## 2013-09-29 DIAGNOSIS — R7309 Other abnormal glucose: Secondary | ICD-10-CM

## 2013-09-29 DIAGNOSIS — F329 Major depressive disorder, single episode, unspecified: Secondary | ICD-10-CM

## 2013-09-29 DIAGNOSIS — I10 Essential (primary) hypertension: Secondary | ICD-10-CM

## 2013-09-29 DIAGNOSIS — E785 Hyperlipidemia, unspecified: Secondary | ICD-10-CM | POA: Insufficient documentation

## 2013-09-29 DIAGNOSIS — F3289 Other specified depressive episodes: Secondary | ICD-10-CM

## 2013-09-29 LAB — COMPREHENSIVE METABOLIC PANEL
ALBUMIN: 4.4 g/dL (ref 3.5–5.2)
ALT: 18 U/L (ref 0–35)
AST: 23 U/L (ref 0–37)
Alkaline Phosphatase: 51 U/L (ref 39–117)
BUN: 16 mg/dL (ref 6–23)
CALCIUM: 9.6 mg/dL (ref 8.4–10.5)
CHLORIDE: 98 meq/L (ref 96–112)
CO2: 30 mEq/L (ref 19–32)
Creatinine, Ser: 0.8 mg/dL (ref 0.4–1.2)
GFR: 70.26 mL/min (ref >60.00)
GLUCOSE: 96 mg/dL (ref 70–99)
POTASSIUM: 4.1 meq/L (ref 3.5–5.1)
SODIUM: 137 meq/L (ref 135–145)
TOTAL PROTEIN: 7.6 g/dL (ref 6.0–8.3)
Total Bilirubin: 0.8 mg/dL (ref 0.3–1.2)

## 2013-09-29 LAB — LIPID PANEL
CHOL/HDL RATIO: 4
Cholesterol: 241 mg/dL — ABNORMAL HIGH (ref 0–200)
HDL: 65.1 mg/dL (ref >39.00)
TRIGLYCERIDES: 121 mg/dL (ref 0.0–149.0)
VLDL: 24.2 mg/dL (ref 0.0–40.0)

## 2013-09-29 LAB — CBC
HCT: 38.3 % (ref 36.0–46.0)
Hemoglobin: 12.9 g/dL (ref 12.0–15.0)
MCHC: 33.7 g/dL (ref 30.0–36.0)
MCV: 91.2 fl (ref 78.0–100.0)
PLATELETS: 236 10*3/uL (ref 150.0–400.0)
RBC: 4.21 Mil/uL (ref 3.87–5.11)
RDW: 13.6 % (ref 11.5–14.6)
WBC: 5.2 10*3/uL (ref 4.5–10.5)

## 2013-09-29 LAB — HEMOGLOBIN A1C: Hgb A1c MFr Bld: 5.7 % (ref 4.6–6.5)

## 2013-09-29 LAB — TSH: TSH: 1.88 u[IU]/mL (ref 0.35–5.50)

## 2013-09-29 LAB — LDL CHOLESTEROL, DIRECT: LDL DIRECT: 159.7 mg/dL

## 2013-09-29 NOTE — Patient Instructions (Signed)

## 2013-09-29 NOTE — Assessment & Plan Note (Signed)
Will check lipid profile today Ok to continue red yeast rice

## 2013-09-29 NOTE — Assessment & Plan Note (Signed)
Well controlled on Zoloft Continue for now

## 2013-09-29 NOTE — Assessment & Plan Note (Signed)
Well controlled Will continue current therapy at this time Will check CBC and CMET

## 2013-09-29 NOTE — Progress Notes (Signed)
Pre-visit discussion using our clinic review tool. No additional management support is needed unless otherwise documented below in the visit note.  

## 2013-09-29 NOTE — Progress Notes (Signed)
HPI Pt presents to the clinic today to establish care. She is transferring care from [facility]. She has no concerns today.  Flu: 05/2013 Tetanus: unsure of date Pneumovax: unsure of date Zostovax: 2012 Pap Smear: 2012 Mammogram: 11/2012 Bone Density: 2011 Colonoscopy: 2011 Eye Doctor: yearly Dentist: biannually   Past Medical History  Diagnosis Date  . Depression   . Hypertension   . Arthritis   . Allergy     Current Outpatient Prescriptions  Medication Sig Dispense Refill  . aspirin 81 MG tablet Take 81 mg by mouth daily.        . Calcium Carbonate (CALCIUM 600 PO) Take 2 capsules by mouth daily.      . Calcium Carbonate-Vitamin D (CALTRATE 600+D) 600-400 MG-UNIT per chew tablet Chew 1 tablet by mouth daily.        . cholecalciferol (VITAMIN D) 1000 UNITS tablet Take 1,000 Units by mouth daily.      . clonazePAM (KLONOPIN) 0.5 MG tablet Take 0.5 mg by mouth at bedtime.      . fish oil-omega-3 fatty acids 1000 MG capsule Take 1,000 mg by mouth daily.        . Magnesium 500 MG CAPS Take 1 capsule by mouth daily.      . Multiple Vitamin (MULTIVITAMIN) capsule Take 1 capsule by mouth daily.        . Red Yeast Rice Extract (RED YEAST RICE PO) Take 1 capsule by mouth daily.      . sertraline (ZOLOFT) 100 MG tablet Take 100 mg by mouth daily.        . TRIAMTERENE-HCTZ PO Take 37.5 mg by mouth daily.        . vitamin B-12 (CYANOCOBALAMIN) 1000 MCG tablet Take 1,000 mcg by mouth daily.       No current facility-administered medications for this visit.    Allergies  Allergen Reactions  . Codeine Anxiety    Family History  Problem Relation Age of Onset  . Hypertension Sister   . Hypertension Brother   . Diabetes Brother   . Cancer Mother     CERVICAL  . Heart disease Mother   . Stroke Mother   . Hypertension Mother   . Heart disease Father   . Hypertension Father   . Diabetes Father     History   Social History  . Marital Status: Widowed    Spouse  Name: N/A    Number of Children: N/A  . Years of Education: N/A   Occupational History  . Not on file.   Social History Main Topics  . Smoking status: Never Smoker   . Smokeless tobacco: Never Used  . Alcohol Use: No  . Drug Use: Not on file  . Sexual Activity: No   Other Topics Concern  . Not on file   Social History Narrative  . No narrative on file    ROS:  Constitutional: Denies fever, malaise, fatigue, headache or abrupt weight changes.  HEENT: Pt reports nasal congestion. Denies eye pain, eye redness, ear pain, ringing in the ears, wax buildup, runny nose, bloody nose, or sore throat. Respiratory: Denies difficulty breathing, shortness of breath, cough or sputum production.   Cardiovascular: Denies chest pain, chest tightness, palpitations or swelling in the hands or feet.  Gastrointestinal: Denies abdominal pain, bloating, constipation, diarrhea or blood in the stool.  GU: Denies frequency, urgency, pain with urination, blood in urine, odor or discharge. Musculoskeletal: Denies decrease in range of motion, difficulty with gait,  muscle pain or joint pain and swelling.  Skin: Denies redness, rashes, lesions or ulcercations.  Neurological: Denies dizziness, difficulty with memory, difficulty with speech or problems with balance and coordination.   No other specific complaints in a complete review of systems (except as listed in HPI above).  PE:  BP 128/78  Pulse 60  Temp(Src) 97.7 F (36.5 C) (Oral)  Ht 5' 2.25" (1.581 m)  Wt 158 lb 8 oz (71.895 kg)  BMI 28.76 kg/m2  SpO2 98% Wt Readings from Last 3 Encounters:  09/29/13 158 lb 8 oz (71.895 kg)  01/13/13 156 lb (70.761 kg)  04/05/11 150 lb (68.04 kg)    General: Appears her stated age, well developed, well nourished in NAD. HEENT: Head: normal shape and size; Eyes: sclera white, no icterus, conjunctiva pink, PERRLA and EOMs intact; Ears: Tm's gray and intact, normal light reflex; Nose: mucosa pink and moist,  septum midline; Throat/Mouth: Teeth present, mucosa pink and moist, no lesions or ulcerations noted.  Neck: Normal range of motion. Neck supple, trachea midline. No massses, lumps or thyromegaly present.  Cardiovascular: Normal rate and rhythm. S1,S2 noted.  No murmur, rubs or gallops noted. No JVD or BLE edema. No carotid bruits noted. Pulmonary/Chest: Normal effort and positive vesicular breath sounds. No respiratory distress. No wheezes, rales or ronchi noted.  Abdomen: Soft and nontender. Normal bowel sounds, no bruits noted. No distention or masses noted. Liver, spleen and kidneys non palpable. Musculoskeletal: Normal range of motion. No signs of joint swelling. No difficulty with gait.  Neurological: Alert and oriented. Cranial nerves II-XII intact. Coordination normal. +DTRs bilaterally. Psychiatric: Mood and affect normal. Behavior is normal. Judgment and thought content normal.     BMET    Component Value Date/Time   NA 142 02/14/2009 0450   K 3.8 02/14/2009 0450   CL 103 02/14/2009 0450   CO2 34* 02/14/2009 0450   GLUCOSE 100* 02/14/2009 0450   BUN 8 02/14/2009 0450   CREATININE 0.73 02/14/2009 0450   CALCIUM 8.8 02/14/2009 0450   GFRNONAA >60 02/14/2009 0450   GFRAA  Value: >60        The eGFR has been calculated using the MDRD equation. This calculation has not been validated in all clinical situations. eGFR's persistently <60 mL/min signify possible Chronic Kidney Disease. 02/14/2009 0450    Lipid Panel  No results found for this basename: chol, trig, hdl, cholhdl, vldl, ldlcalc    CBC    Component Value Date/Time   WBC 4.5 02/14/2009 0450   RBC 2.83* 02/14/2009 0450   HGB 9.1* 02/14/2009 0450   HCT 26.3* 02/14/2009 0450   PLT 183 02/14/2009 0450   MCV 92.9 02/14/2009 0450   MCHC 34.4 02/14/2009 0450   RDW 13.0 02/14/2009 0450    Hgb A1C No results found for this basename: HGBA1C     Assessment and Plan:  RTC in 6 months for medicare wellness exam or sooner if needed

## 2013-10-08 ENCOUNTER — Other Ambulatory Visit: Payer: Self-pay

## 2013-10-08 DIAGNOSIS — Z1231 Encounter for screening mammogram for malignant neoplasm of breast: Secondary | ICD-10-CM

## 2013-10-30 ENCOUNTER — Encounter: Payer: Self-pay | Admitting: Internal Medicine

## 2013-11-12 ENCOUNTER — Ambulatory Visit
Admission: RE | Admit: 2013-11-12 | Discharge: 2013-11-12 | Disposition: A | Discharge status: Home / Self Care | Payer: Medicare HMO | Source: Ambulatory Visit

## 2013-11-12 DIAGNOSIS — Z1231 Encounter for screening mammogram for malignant neoplasm of breast: Secondary | ICD-10-CM

## 2013-11-17 ENCOUNTER — Ambulatory Visit (INDEPENDENT_AMBULATORY_CARE_PROVIDER_SITE_OTHER): Payer: Medicare HMO | Admitting: Internal Medicine

## 2013-11-17 VITALS — BP 132/84 | HR 70 | Temp 98.3°F | Wt 158.5 lb

## 2013-11-17 DIAGNOSIS — J309 Allergic rhinitis, unspecified: Secondary | ICD-10-CM

## 2013-11-17 DIAGNOSIS — R21 Rash and other nonspecific skin eruption: Secondary | ICD-10-CM

## 2013-11-17 DIAGNOSIS — L309 Dermatitis, unspecified: Secondary | ICD-10-CM

## 2013-11-17 DIAGNOSIS — L259 Unspecified contact dermatitis, unspecified cause: Secondary | ICD-10-CM

## 2013-11-17 NOTE — Patient Instructions (Addendum)

## 2013-11-17 NOTE — Progress Notes (Signed)
Pre visit review using our clinic review tool, if applicable. No additional management support is needed unless otherwise documented below in the visit note. 

## 2013-11-17 NOTE — Progress Notes (Signed)
Subjective:    Patient ID: Monica Fuller, female    DOB: [birth year], 73 y.o.   MRN: [id number]  HPI  Pt presents to the clinic today with c/o a rash on the front of her neck, face and scalp. She noticed this 1 month ago. The rash is itchy. She has not tried any new soaps, detergents and lotions. She has tried hydrocortisone cream which helps relief the itching but the rash has not gone away. Additionally, she c/o itchy eyes, runny nose and sneezing. This is a chronic issue for her but it seems to be getting worse. She has tried Flonase in the past. She has not tried any antihistamine.  Review of Systems      Past Medical History  Diagnosis Date  . Depression   . Hypertension   . Arthritis   . Allergy     Current Outpatient Prescriptions  Medication Sig Dispense Refill  . aspirin 81 MG tablet Take 81 mg by mouth daily.        . Calcium Carbonate (CALCIUM 600 PO) Take 2 capsules by mouth daily.      . Calcium Carbonate-Vitamin D (CALTRATE 600+D) 600-400 MG-UNIT per chew tablet Chew 1 tablet by mouth daily.        . cholecalciferol (VITAMIN D) 1000 UNITS tablet Take 1,000 Units by mouth daily.      . clonazePAM (KLONOPIN) 0.5 MG tablet Take 0.5 mg by mouth at bedtime.      . fish oil-omega-3 fatty acids 1000 MG capsule Take 1,000 mg by mouth daily.        . Magnesium 500 MG CAPS Take 1 capsule by mouth daily.      . Multiple Vitamin (MULTIVITAMIN) capsule Take 1 capsule by mouth daily.        . Red Yeast Rice Extract (RED YEAST RICE PO) Take 1 capsule by mouth daily.      . sertraline (ZOLOFT) 100 MG tablet Take 100 mg by mouth daily.        . TRIAMTERENE-HCTZ PO Take 37.5 mg by mouth daily.        . vitamin B-12 (CYANOCOBALAMIN) 1000 MCG tablet Take 1,000 mcg by mouth daily.       No current facility-administered medications for this visit.    Allergies  Allergen Reactions  . Codeine Anxiety    Family History  Problem Relation Age of Onset  . Hypertension Sister   .  Hypertension Brother   . Diabetes Brother   . Cancer Mother     CERVICAL  . Heart disease Mother   . Stroke Mother   . Hypertension Mother   . Heart disease Father   . Hypertension Father   . Diabetes Father     History   Social History  . Marital Status: Widowed    Spouse Name: N/A    Number of Children: N/A  . Years of Education: N/A   Occupational History  . Not on file.   Social History Main Topics  . Smoking status: Never Smoker   . Smokeless tobacco: Never Used  . Alcohol Use: No  . Drug Use: Not on file  . Sexual Activity: No   Other Topics Concern  . Not on file   Social History Narrative  . No narrative on file     Constitutional: Denies fever, malaise, fatigue, headache or abrupt weight changes.   Skin: Pt reports rash. Denies redness, lesions or ulcercations.  HEENT: Pt reports runny nose, itchy  eyes and sneezing. Denies ear pain, fulness, nasal congestion or sore throat.  No other specific complaints in a complete review of systems (except as listed in HPI above).  Objective:   Physical Exam   BP 132/84  Pulse 70  Temp(Src) 98.3 F (36.8 C) (Oral)  Wt 158 lb 8 oz (71.895 kg)  SpO2 98% Wt Readings from Last 3 Encounters:  11/17/13 158 lb 8 oz (71.895 kg)  09/29/13 158 lb 8 oz (71.895 kg)  01/13/13 156 lb (70.761 kg)    General: Appears her stated age, well developed, well nourished in NAD. Skin: Warm, dry and intact. Dry scaly appearance to skin on upper chest and neck. No lesions or ulcerations noted. HEENT: Sclera white, EOM's normal. Nasal mucous edematous, + PND. Cardiovascular: Normal rate and rhythm. S1,S2 noted.  No murmur, rubs or gallops noted. No JVD or BLE edema. No carotid bruits noted. Pulmonary/Chest: Normal effort and positive vesicular breath sounds. No respiratory distress. No wheezes, rales or ronchi noted.    BMET    Component Value Date/Time   NA 137 09/29/2013 1007   K 4.1 09/29/2013 1007   CL 98 09/29/2013 1007    CO2 30 09/29/2013 1007   GLUCOSE 96 09/29/2013 1007   BUN 16 09/29/2013 1007   CREATININE 0.8 09/29/2013 1007   CALCIUM 9.6 09/29/2013 1007   GFRNONAA >60 02/14/2009 0450   GFRAA  Value: >60        The eGFR has been calculated using the MDRD equation. This calculation has not been validated in all clinical situations. eGFR's persistently <60 mL/min signify possible Chronic Kidney Disease. 02/14/2009 0450    Lipid Panel     Component Value Date/Time   CHOL 241* 09/29/2013 1007   TRIG 121.0 09/29/2013 1007   HDL 65.10 09/29/2013 1007   CHOLHDL 4 09/29/2013 1007   VLDL 24.2 09/29/2013 1007    CBC    Component Value Date/Time   WBC 5.2 09/29/2013 1007   RBC 4.21 09/29/2013 1007   HGB 12.9 09/29/2013 1007   HCT 38.3 09/29/2013 1007   PLT 236.0 09/29/2013 1007   MCV 91.2 09/29/2013 1007   MCHC 33.7 09/29/2013 1007   RDW 13.6 09/29/2013 1007    Hgb A1C Lab Results  Component Value Date   HGBA1C 5.7 09/29/2013        Assessment & Plan:   Allergic Rhinitis:  Try Allegra or Claritin OTC daily  Eczema:  The allegra or claritin will help Use a lotion such as eucerin or gold bond twice daily OK to continue hydrocortisone cream  RTC as needed or if symptoms persist or worsen

## 2014-03-23 ENCOUNTER — Ambulatory Visit (INDEPENDENT_AMBULATORY_CARE_PROVIDER_SITE_OTHER): Payer: Medicare HMO | Admitting: Internal Medicine

## 2014-03-23 ENCOUNTER — Encounter: Payer: Self-pay | Admitting: Internal Medicine

## 2014-03-23 VITALS — BP 120/66 | HR 71 | Temp 98.1°F | Ht 61.75 in | Wt 157.5 lb

## 2014-03-23 DIAGNOSIS — F3289 Other specified depressive episodes: Secondary | ICD-10-CM

## 2014-03-23 DIAGNOSIS — F329 Major depressive disorder, single episode, unspecified: Secondary | ICD-10-CM

## 2014-03-23 DIAGNOSIS — Z0000 Encounter for general adult medical examination without abnormal findings: Secondary | ICD-10-CM

## 2014-03-23 DIAGNOSIS — F32A Depression, unspecified: Secondary | ICD-10-CM

## 2014-03-23 MED ORDER — CITALOPRAM HYDROBROMIDE 20 MG PO TABS: 20.0000 mg | ORAL_TABLET | Freq: Every day | ORAL | Status: DC

## 2014-03-23 NOTE — Patient Instructions (Addendum)

## 2014-03-23 NOTE — Progress Notes (Signed)
HPI:  Pt presents to the clinic today for her annual medicare wellness exam. She does have some concerns today about depression. She was recently started back on her Zoloft which seemed to work well for her. She reports that it is not working well anymore. She would like to consider switching to a different medication. She reports she still feels down at times. She has no thoughts of SI/HI at this time. She does have to take her Klonipin about 2 times per week.  Past Medical History  Diagnosis Date  . Depression   . Hypertension   . Arthritis   . Allergy     Current Outpatient Prescriptions  Medication Sig Dispense Refill  . aspirin 81 MG tablet Take 81 mg by mouth daily.        . Calcium Carbonate (CALCIUM 600 PO) Take 2 capsules by mouth daily.      . Calcium Carbonate-Vitamin D (CALTRATE 600+D) 600-400 MG-UNIT per chew tablet Chew 1 tablet by mouth daily.        . cholecalciferol (VITAMIN D) 1000 UNITS tablet Take 1,000 Units by mouth daily.      . clonazePAM (KLONOPIN) 0.5 MG tablet Take 0.5 mg by mouth at bedtime.      . fish oil-omega-3 fatty acids 1000 MG capsule Take 1,000 mg by mouth daily.        . Magnesium 500 MG CAPS Take 1 capsule by mouth daily.      . Multiple Vitamin (MULTIVITAMIN) capsule Take 1 capsule by mouth daily.        . Red Yeast Rice Extract (RED YEAST RICE PO) Take 1 capsule by mouth daily.      . sertraline (ZOLOFT) 100 MG tablet Take 100 mg by mouth daily.        . TRIAMTERENE-HCTZ PO Take 37.5 mg by mouth daily.        . vitamin B-12 (CYANOCOBALAMIN) 1000 MCG tablet Take 1,000 mcg by mouth daily.       No current facility-administered medications for this visit.    Allergies  Allergen Reactions  . Codeine Anxiety    Family History  Problem Relation Age of Onset  . Hypertension Sister   . Hypertension Brother   . Diabetes Brother   . Cancer Mother     CERVICAL  . Heart disease Mother   . Stroke Mother   . Hypertension Mother   . Heart disease  Father   . Hypertension Father   . Diabetes Father     History   Social History  . Marital Status: Widowed    Spouse Name: N/A    Number of Children: N/A  . Years of Education: N/A   Occupational History  . Not on file.   Social History Main Topics  . Smoking status: Never Smoker   . Smokeless tobacco: Never Used  . Alcohol Use: No  . Drug Use: Not on file  . Sexual Activity: No   Other Topics Concern  . Not on file   Social History Narrative  . No narrative on file    Hospitiliaztions: None  Health Maintenance:    Flu: 2014 at [facility]  Tetanus: 2009  Pneumovax: 2012  Zostavax: 2012  Pap Smear: 2012  Mammogram: 09/2013  Bone Density: 2013  Colonoscopy: 2013  Eye Doctor: yearly  Dental Exam: biannually  Providers:   Dermatology:  Corliss Blacker  Opthalmology: [facility]  I have personally reviewed and have noted:  1. The patient's medical  and social history 2. Their use of alcohol, tobacco or illicit drugs 3. Their current medications and supplements 4. The patient's functional ability including ADL's, fall  risks, home safety risks and  hearing or visual  impairment. 5. Diet and physical activities 6. Evidence for depression or mood disorder  Subjective:   Review of Systems:   Constitutional: Denies fever, malaise, fatigue, headache or abrupt weight changes.  HEENT: Denies eye pain, eye redness, ear pain, ringing in the ears, wax buildup, runny nose, nasal congestion, bloody nose, or sore throat. Respiratory: Denies difficulty breathing, shortness of breath, cough or sputum production.   Cardiovascular: Denies chest pain, chest tightness, palpitations or swelling in the hands or feet.  Gastrointestinal: Pt reports constipation. Denies abdominal pain, bloating, constipation, diarrhea or blood in the stool.  GU: Denies urgency, frequency, pain with urination, burning sensation, blood in urine, odor or discharge. Musculoskeletal: Denies  decrease in range of motion, difficulty with gait, muscle pain or joint pain and swelling.  Skin: Denies redness, rashes, lesions or ulcercations.  Neurological: Denies dizziness, difficulty with memory, difficulty with speech or problems with balance and coordination.  Psych: Pt reports depression. Denies anxiety, SI/HI.  No other specific complaints in a complete review of systems (except as listed in HPI above).  Objective:  PE:   BP 120/66  Pulse 71  Temp(Src) 98.1 F (36.7 C) (Oral)  Ht 5' 1.75" (1.568 m)  Wt 157 lb 8 oz (71.442 kg)  BMI 29.06 kg/m2  SpO2 96% Wt Readings from Last 3 Encounters:  03/23/14 157 lb 8 oz (71.442 kg)  11/17/13 158 lb 8 oz (71.895 kg)  09/29/13 158 lb 8 oz (71.895 kg)    General: Appears her stated age, well developed, well nourished in NAD. Psychiatric: Mood and affect normal. Behavior is normal. Judgment and thought content normal.     BMET    Component Value Date/Time   NA 137 09/29/2013 1007   K 4.1 09/29/2013 1007   CL 98 09/29/2013 1007   CO2 30 09/29/2013 1007   GLUCOSE 96 09/29/2013 1007   BUN 16 09/29/2013 1007   CREATININE 0.8 09/29/2013 1007   CALCIUM 9.6 09/29/2013 1007   GFRNONAA >60 02/14/2009 0450   GFRAA  Value: >60        The eGFR has been calculated using the MDRD equation. This calculation has not been validated in all clinical situations. eGFR's persistently <60 mL/min signify possible Chronic Kidney Disease. 02/14/2009 0450    Lipid Panel     Component Value Date/Time   CHOL 241* 09/29/2013 1007   TRIG 121.0 09/29/2013 1007   HDL 65.10 09/29/2013 1007   CHOLHDL 4 09/29/2013 1007   VLDL 24.2 09/29/2013 1007    CBC    Component Value Date/Time   WBC 5.2 09/29/2013 1007   RBC 4.21 09/29/2013 1007   HGB 12.9 09/29/2013 1007   HCT 38.3 09/29/2013 1007   PLT 236.0 09/29/2013 1007   MCV 91.2 09/29/2013 1007   MCHC 33.7 09/29/2013 1007   RDW 13.6 09/29/2013 1007    Hgb A1C Lab Results  Component Value Date   HGBA1C 5.7  09/29/2013      Assessment and Plan:   Medicare Annual Wellness Visit:  Diet: She eats what she wants. No particular diet. Physical activity: Treadmill 3 x week at [facility] Depression/mood screen: Positive, on Zoloft, not effective Hearing: Intact to whispered voice Visual acuity: Grossly normal, performs annual eye exam, wears contacts  ADLs: Capable Fall risk: None  Home safety: Good Cognitive evaluation: Intact to orientation, naming, recall and repetition EOL planning: Adv directives, full code/ I agree  Preventative Medicine:  She will be due for bone density and flu shot 06/2014  Next appointment: 6 month follow up with me

## 2014-03-23 NOTE — Assessment & Plan Note (Signed)
Not well controlled on zoloft Will try switching to Celexa  Let me know how your are doing in about 4-6 weeks

## 2014-03-23 NOTE — Progress Notes (Signed)
Pre visit review using our clinic review tool, if applicable. No additional management support is needed unless otherwise documented below in the visit note. 

## 2014-08-24 ENCOUNTER — Ambulatory Visit (INDEPENDENT_AMBULATORY_CARE_PROVIDER_SITE_OTHER): Payer: Medicare HMO | Admitting: Internal Medicine

## 2014-08-24 ENCOUNTER — Encounter: Payer: Self-pay | Admitting: Internal Medicine

## 2014-08-24 VITALS — BP 120/60 | HR 68 | Temp 98.0°F | Wt 160.0 lb

## 2014-08-24 DIAGNOSIS — M8580 Other specified disorders of bone density and structure, unspecified site: Secondary | ICD-10-CM

## 2014-08-24 DIAGNOSIS — F329 Major depressive disorder, single episode, unspecified: Secondary | ICD-10-CM

## 2014-08-24 DIAGNOSIS — F418 Other specified anxiety disorders: Secondary | ICD-10-CM

## 2014-08-24 DIAGNOSIS — E785 Hyperlipidemia, unspecified: Secondary | ICD-10-CM

## 2014-08-24 DIAGNOSIS — F419 Anxiety disorder, unspecified: Secondary | ICD-10-CM

## 2014-08-24 DIAGNOSIS — F32A Depression, unspecified: Secondary | ICD-10-CM

## 2014-08-24 DIAGNOSIS — I10 Essential (primary) hypertension: Secondary | ICD-10-CM

## 2014-08-24 DIAGNOSIS — R3915 Urgency of urination: Secondary | ICD-10-CM

## 2014-08-24 DIAGNOSIS — K648 Other hemorrhoids: Secondary | ICD-10-CM

## 2014-08-24 LAB — COMPREHENSIVE METABOLIC PANEL
ALK PHOS: 53 U/L (ref 39–117)
ALT: 24 U/L (ref 0–35)
AST: 26 U/L (ref 0–37)
Albumin: 4.3 g/dL (ref 3.5–5.2)
BILIRUBIN TOTAL: 0.6 mg/dL (ref 0.2–1.2)
BUN: 21 mg/dL (ref 6–23)
CO2: 27 meq/L (ref 19–32)
CREATININE: 0.8 mg/dL (ref 0.4–1.2)
Calcium: 9.3 mg/dL (ref 8.4–10.5)
Chloride: 103 mEq/L (ref 96–112)
GFR: 70.09 mL/min (ref >60.00)
GLUCOSE: 91 mg/dL (ref 70–99)
Potassium: 4 mEq/L (ref 3.5–5.1)
SODIUM: 136 meq/L (ref 135–145)
TOTAL PROTEIN: 6.9 g/dL (ref 6.0–8.3)

## 2014-08-24 LAB — POCT URINALYSIS DIPSTICK
BILIRUBIN UA: NEGATIVE
GLUCOSE UA: NEGATIVE
KETONES UA: NEGATIVE
Leukocytes, UA: NEGATIVE
NITRITE UA: NEGATIVE
PH UA: 6.5
Protein, UA: NEGATIVE
RBC UA: NEGATIVE
Spec Grav, UA: 1.02
Urobilinogen, UA: NEGATIVE

## 2014-08-24 LAB — CBC
HEMATOCRIT: 38 % (ref 36.0–46.0)
HEMOGLOBIN: 12.6 g/dL (ref 12.0–15.0)
MCHC: 33.3 g/dL (ref 30.0–36.0)
MCV: 92.4 fl (ref 78.0–100.0)
PLATELETS: 217 10*3/uL (ref 150.0–400.0)
RBC: 4.11 Mil/uL (ref 3.87–5.11)
RDW: 13.6 % (ref 11.5–15.5)
WBC: 3.9 10*3/uL — AB (ref 4.0–10.5)

## 2014-08-24 LAB — VITAMIN D 25 HYDROXY (VIT D DEFICIENCY, FRACTURES): VITD: 59.38 ng/mL (ref 30.00–100.00)

## 2014-08-24 LAB — LIPID PANEL
CHOLESTEROL: 241 mg/dL — AB (ref 0–200)
HDL: 69.5 mg/dL (ref >39.00)
LDL CALC: 147 mg/dL — AB (ref 0–99)
NonHDL: 171.5
TRIGLYCERIDES: 121 mg/dL (ref 0.0–149.0)
Total CHOL/HDL Ratio: 3
VLDL: 24.2 mg/dL (ref 0.0–40.0)

## 2014-08-24 MED ORDER — SERTRALINE HCL 50 MG PO TABS: 50.0000 mg | ORAL_TABLET | Freq: Every day | ORAL | Status: DC

## 2014-08-24 MED ORDER — TRIAMTERENE-HCTZ 75-50 MG PO TABS: 1.0000 | ORAL_TABLET | Freq: Every day | ORAL | Status: DC

## 2014-08-24 MED ORDER — SERTRALINE HCL 100 MG PO TABS: 100.0000 mg | ORAL_TABLET | Freq: Every day | ORAL | Status: DC

## 2014-08-24 MED ORDER — CLONAZEPAM 0.5 MG PO TABS: 0.5000 mg | ORAL_TABLET | Freq: Every day | ORAL | Status: DC

## 2014-08-24 NOTE — Patient Instructions (Signed)
Fat and Cholesterol Control Diet Fat and cholesterol levels in your blood and organs are influenced by your diet. High levels of fat and cholesterol may lead to diseases of the heart, small and large blood vessels, gallbladder, liver, and pancreas. CONTROLLING FAT AND CHOLESTEROL WITH DIET Although exercise and lifestyle factors are important, your diet is key. That is because certain foods are known to raise cholesterol and others to lower it. The goal is to balance foods for their effect on cholesterol and more importantly, to replace saturated and trans fat with other types of fat, such as monounsaturated fat, polyunsaturated fat, and omega-3 fatty acids. On average, a person should consume no more than 15 to 17 g of saturated fat daily. Saturated and trans fats are considered "bad" fats, and they will raise LDL cholesterol. Saturated fats are primarily found in animal products such as meats, butter, and cream. However, that does not mean you need to give up all your favorite foods. Today, there are good tasting, low-fat, low-cholesterol substitutes for most of the things you like to eat. Choose low-fat or nonfat alternatives. Choose round or loin cuts of red meat. These types of cuts are lowest in fat and cholesterol. Chicken (without the skin), fish, veal, and ground turkey breast are great choices. Eliminate fatty meats, such as hot dogs and salami. Even shellfish have little or no saturated fat. Have a 3 oz (85 g) portion when you eat lean meat, poultry, or fish. Trans fats are also called "partially hydrogenated oils." They are oils that have been scientifically manipulated so that they are solid at room temperature resulting in a longer shelf life and improved taste and texture of foods in which they are added. Trans fats are found in stick margarine, some tub margarines, cookies, crackers, and baked goods.  When baking and cooking, oils are a great substitute for butter. The monounsaturated oils are  especially beneficial since it is believed they lower LDL and raise HDL. The oils you should avoid entirely are saturated tropical oils, such as coconut and palm.  Remember to eat a lot from food groups that are naturally free of saturated and trans fat, including fish, fruit, vegetables, beans, grains (barley, rice, couscous, bulgur wheat), and pasta (without cream sauces).  IDENTIFYING FOODS THAT LOWER FAT AND CHOLESTEROL  Soluble fiber may lower your cholesterol. This type of fiber is found in fruits such as apples, vegetables such as broccoli, potatoes, and carrots, legumes such as beans, peas, and lentils, and grains such as barley. Foods fortified with plant sterols (phytosterol) may also lower cholesterol. You should eat at least 2 g per day of these foods for a cholesterol lowering effect.  Read package labels to identify low-saturated fats, trans fat free, and low-fat foods at the supermarket. Select cheeses that have only 2 to 3 g saturated fat per ounce. Use a heart-healthy tub margarine that is free of trans fats or partially hydrogenated oil. When buying baked goods (cookies, crackers), avoid partially hydrogenated oils. Breads and muffins should be made from whole grains (whole-wheat or whole oat flour, instead of "flour" or "enriched flour"). Buy non-creamy canned soups with reduced salt and no added fats.  FOOD PREPARATION TECHNIQUES  Never deep-fry. If you must fry, either stir-fry, which uses very little fat, or use non-stick cooking sprays. When possible, broil, bake, or roast meats, and steam vegetables. Instead of putting butter or margarine on vegetables, use lemon and herbs, applesauce, and cinnamon (for squash and sweet potatoes). Use nonfat   yogurt, salsa, and low-fat dressings for salads.  LOW-SATURATED FAT / LOW-FAT FOOD SUBSTITUTES Meats / Saturated Fat (g)  Avoid: Steak, marbled (3 oz/85 g) / 11 g  Choose: Steak, lean (3 oz/85 g) / 4 g  Avoid: Hamburger (3 oz/85 g) / 7  g  Choose: Hamburger, lean (3 oz/85 g) / 5 g  Avoid: Ham (3 oz/85 g) / 6 g  Choose: Ham, lean cut (3 oz/85 g) / 2.4 g  Avoid: Chicken, with skin, dark meat (3 oz/85 g) / 4 g  Choose: Chicken, skin removed, dark meat (3 oz/85 g) / 2 g  Avoid: Chicken, with skin, light meat (3 oz/85 g) / 2.5 g  Choose: Chicken, skin removed, light meat (3 oz/85 g) / 1 g Dairy / Saturated Fat (g)  Avoid: Whole milk (1 cup) / 5 g  Choose: Low-fat milk, 2% (1 cup) / 3 g  Choose: Low-fat milk, 1% (1 cup) / 1.5 g  Choose: Skim milk (1 cup) / 0.3 g  Avoid: Hard cheese (1 oz/28 g) / 6 g  Choose: Skim milk cheese (1 oz/28 g) / 2 to 3 g  Avoid: Cottage cheese, 4% fat (1 cup) / 6.5 g  Choose: Low-fat cottage cheese, 1% fat (1 cup) / 1.5 g  Avoid: Ice cream (1 cup) / 9 g  Choose: Sherbet (1 cup) / 2.5 g  Choose: Nonfat frozen yogurt (1 cup) / 0.3 g  Choose: Frozen fruit bar / trace  Avoid: Whipped cream (1 tbs) / 3.5 g  Choose: Nondairy whipped topping (1 tbs) / 1 g Condiments / Saturated Fat (g)  Avoid: Mayonnaise (1 tbs) / 2 g  Choose: Low-fat mayonnaise (1 tbs) / 1 g  Avoid: Butter (1 tbs) / 7 g  Choose: Extra light margarine (1 tbs) / 1 g  Avoid: Coconut oil (1 tbs) / 11.8 g  Choose: Olive oil (1 tbs) / 1.8 g  Choose: Corn oil (1 tbs) / 1.7 g  Choose: Safflower oil (1 tbs) / 1.2 g  Choose: Sunflower oil (1 tbs) / 1.4 g  Choose: Soybean oil (1 tbs) / 2.4 g  Choose: Canola oil (1 tbs) / 1 g Document Released: 08/28/2005 Document Revised: 12/23/2012 Document Reviewed: 11/26/2013 [employer] Patient Information 2015 [employer]. This information is not intended to replace advice given to you by your health care provider. Make sure you discuss any questions you have with your health care provider.  

## 2014-08-24 NOTE — Assessment & Plan Note (Signed)
Well controlled on Trimaterene-HCTZ Will check CBC and CMET today New RX sent to [facility]

## 2014-08-24 NOTE — Assessment & Plan Note (Signed)
On calcium and Vit D supplement Will repeat Vit D today Will order bone density exam

## 2014-08-24 NOTE — Addendum Note (Signed)
Addended by: Lurlean Nanny on: 08/24/2014 11:55 AM   Modules accepted: Orders

## 2014-08-24 NOTE — Assessment & Plan Note (Signed)
Slightly elevated On red yeast rice Will check Lipid profile and CMET today, she may need a statin

## 2014-08-24 NOTE — Progress Notes (Signed)
Pre visit review using our clinic review tool, if applicable. No additional management support is needed unless otherwise documented below in the visit note. 

## 2014-08-24 NOTE — Assessment & Plan Note (Signed)
Not well controlled on zoloft at the current dose Will increase to 150 mg daily Continue Clonazepam prn Will check CBC and CMET today  Follow up with me in 4-6 weeks if no improvement

## 2014-08-24 NOTE — Progress Notes (Signed)
Subjective:    Patient ID: Monica Fuller, female    DOB: [birth year], 73 y.o.   MRN: [id number]  HPI  Pt presents to the clinic today for follow up of chronic medical conditions.  Anxiety and Depression: She was switch from Zoloft to Celexa 03/2014. She did not return for her 4-6 week follow up. She reports that she stopped the Celexa after taking it for 1 month and went back to the Zoloft. She has not really noted any improvement in her symptoms. She does take the Clonazepam about 1-2 times per week. She denies SI/HI.  HTN: Her BP is well controlled on Triamterene-HCTZ. BP today 120/60.  Osteopenia: Bone density exam from 06/2012 showed osteopenia. She is on calcium and Vit D supplement.  HLD: Lipid profile from 09/2013 reviewed. Total 2241, HDL 65, LDL 159 , Triglycerides 121. She is taking red yeast rice and fish oil daily. She has been on a statin in the past but did not like the side effect profile so she stopped taking.  She does have some concerns today about urinary urgency. This has been going on for a few months now. She feels like her bladder is not emptying like it should. She denies burning or pain with urination.  She has also noticed some blood with bowel movements. She reports this occurs about 1 time per month. It is a small amount of bright red blood. She has had issues with constipation. She has tried a probiotic OTC and reports it has helped a little bit.   Review of Systems      Past Medical History  Diagnosis Date  . Depression   . Hypertension   . Arthritis   . Allergy     Current Outpatient Prescriptions  Medication Sig Dispense Refill  . aspirin 81 MG tablet Take 81 mg by mouth daily.      . Calcium Carbonate (CALCIUM 600 PO) Take 2 capsules by mouth daily.    . Calcium Carbonate-Vitamin D (CALTRATE 600+D) 600-400 MG-UNIT per chew tablet Chew 1 tablet by mouth daily.      . cholecalciferol (VITAMIN D) 1000 UNITS tablet Take 1,000 Units by mouth daily.    .  citalopram (CELEXA) 20 MG tablet Take 1 tablet (20 mg total) by mouth daily. 30 tablet 3  . clonazePAM (KLONOPIN) 0.5 MG tablet Take 0.5 mg by mouth at bedtime.    . fish oil-omega-3 fatty acids 1000 MG capsule Take 1,000 mg by mouth daily.      . Magnesium 500 MG CAPS Take 1 capsule by mouth daily.    . Multiple Vitamin (MULTIVITAMIN) capsule Take 1 capsule by mouth daily.      . Red Yeast Rice Extract (RED YEAST RICE PO) Take 1 capsule by mouth daily.    . TRIAMTERENE-HCTZ PO Take 37.5 mg by mouth daily.      . vitamin B-12 (CYANOCOBALAMIN) 1000 MCG tablet Take 1,000 mcg by mouth daily.     No current facility-administered medications for this visit.    Allergies  Allergen Reactions  . Codeine Anxiety    Family History  Problem Relation Age of Onset  . Hypertension Sister   . Hypertension Brother   . Diabetes Brother   . Cancer Mother     CERVICAL  . Heart disease Mother   . Stroke Mother   . Hypertension Mother   . Heart disease Father   . Hypertension Father   . Diabetes Father     History  Social History  . Marital Status: Widowed    Spouse Name: N/A    Number of Children: N/A  . Years of Education: N/A   Occupational History  . Not on file.   Social History Main Topics  . Smoking status: Never Smoker   . Smokeless tobacco: Never Used  . Alcohol Use: No  . Drug Use: Not on file  . Sexual Activity: No   Other Topics Concern  . Not on file   Social History Narrative  . No narrative on file     Constitutional: Denies fever, malaise, fatigue, headache or abrupt weight changes.  HEENT: Denies eye pain, eye redness, ear pain, ringing in the ears, wax buildup, runny nose, nasal congestion, bloody nose, or sore throat. Respiratory: Denies difficulty breathing, shortness of breath, cough or sputum production.   Cardiovascular: Denies chest pain, chest tightness, palpitations or swelling in the hands or feet.  Gastrointestinal: Pt reports constipation and  blood in stool. Denies abdominal pain, bloating, diarrhea.  GU: Pt reports urgency. Denies  frequency, pain with urination, burning sensation, blood in urine, odor or discharge. Neurological: Denies dizziness, difficulty with memory, difficulty with speech or problems with balance and coordination.  Psych: Pt reports anxiety and depression. Denies SI/HI.  No other specific complaints in a complete review of systems (except as listed in HPI above).  Objective:   Physical Exam  BP 120/60 mmHg  Pulse 68  Temp(Src) 98 F (36.7 C) (Oral)  Wt 160 lb (72.576 kg) Wt Readings from Last 3 Encounters:  08/24/14 160 lb (72.576 kg)  03/23/14 157 lb 8 oz (71.442 kg)  11/17/13 158 lb 8 oz (71.895 kg)    General: Appears her stated age, well developed, well nourished in NAD. Skin: Warm, dry and intact. No rashes, lesions or ulcerations noted. Cardiovascular: Normal rate and rhythm. S1,S2 noted.  No murmur, rubs or gallops noted. No JVD. Trace BLE edema. No carotid bruits noted. Pulmonary/Chest: Normal effort and positive vesicular breath sounds. No respiratory distress. No wheezes, rales or ronchi noted.  Abdomen: Soft and nontender. Sensitive to palpation over the bladder. Normal bowel sounds, no bruits noted. No distention or masses noted. Liver, spleen and kidneys non palpable. No CVA tenderness. Rectal: No external hemorrhoid noted. Normal rectal tone. Internal hemorrhoid noted, not thrombosed or bleeding. Neurological: Alert and oriented.  Psychiatric: Mood and affect normal. Behavior is normal. Judgment and thought content normal.     BMET    Component Value Date/Time   NA 137 09/29/2013 1007   K 4.1 09/29/2013 1007   CL 98 09/29/2013 1007   CO2 30 09/29/2013 1007   GLUCOSE 96 09/29/2013 1007   BUN 16 09/29/2013 1007   CREATININE 0.8 09/29/2013 1007   CALCIUM 9.6 09/29/2013 1007   GFRNONAA >60 02/14/2009 0450   GFRAA  02/14/2009 0450    >60        The eGFR has been  calculated using the MDRD equation. This calculation has not been validated in all clinical situations. eGFR's persistently <60 mL/min signify possible Chronic Kidney Disease.    Lipid Panel     Component Value Date/Time   CHOL 241* 09/29/2013 1007   TRIG 121.0 09/29/2013 1007   HDL 65.10 09/29/2013 1007   CHOLHDL 4 09/29/2013 1007   VLDL 24.2 09/29/2013 1007    CBC    Component Value Date/Time   WBC 5.2 09/29/2013 1007   RBC 4.21 09/29/2013 1007   HGB 12.9 09/29/2013 1007   HCT 38.3  09/29/2013 1007   PLT 236.0 09/29/2013 1007   MCV 91.2 09/29/2013 1007   MCHC 33.7 09/29/2013 1007   RDW 13.6 09/29/2013 1007    Hgb A1C Lab Results  Component Value Date   HGBA1C 5.7 09/29/2013         Assessment & Plan:   Urinary urgency:  Urinalysis: trace leuks ? Overactive bladder She declines medication therapy today Will monitor for now  Internal hemorrhoids:  Ok to take stool softener such as colace prn Continue probiotic daily Increase your daily water intake Watch for persist bleeding or pain  RTC in 6 months for annual wellness visit

## 2014-08-25 ENCOUNTER — Telehealth: Payer: Self-pay | Admitting: Internal Medicine

## 2014-08-25 NOTE — Telephone Encounter (Signed)
emmi mailed  °

## 2014-08-31 ENCOUNTER — Ambulatory Visit
Admission: RE | Admit: 2014-08-31 | Discharge: 2014-08-31 | Disposition: A | Discharge status: Home / Self Care | Payer: Medicare HMO | Source: Ambulatory Visit | Attending: Internal Medicine | Admitting: Internal Medicine

## 2014-08-31 DIAGNOSIS — M8580 Other specified disorders of bone density and structure, unspecified site: Secondary | ICD-10-CM

## 2014-09-25 ENCOUNTER — Ambulatory Visit: Payer: Medicare Other | Admitting: Gynecology

## 2014-11-02 ENCOUNTER — Other Ambulatory Visit: Payer: Self-pay

## 2014-11-02 DIAGNOSIS — Z1231 Encounter for screening mammogram for malignant neoplasm of breast: Secondary | ICD-10-CM

## 2014-11-16 ENCOUNTER — Ambulatory Visit
Admission: RE | Admit: 2014-11-16 | Discharge: 2014-11-16 | Disposition: A | Discharge status: Home / Self Care | Payer: Medicare HMO | Source: Ambulatory Visit

## 2014-11-16 DIAGNOSIS — Z1231 Encounter for screening mammogram for malignant neoplasm of breast: Secondary | ICD-10-CM

## 2014-11-18 ENCOUNTER — Telehealth: Payer: Self-pay

## 2014-11-18 NOTE — Telephone Encounter (Signed)
Pt states she has been taking care of her sister who was Dx with c diff--pt denies abd pain, diarrhea or bloody stool--pt states her other sister who was also helping with the care was tested and was positive for c diff--pt wants to know if she can get tested or what she should do--pt's sister has been on Ax x 6 days--please advise

## 2014-11-18 NOTE — Telephone Encounter (Signed)
She should not be tested if she is not having symptoms

## 2014-11-19 NOTE — Telephone Encounter (Signed)
Left detailed msg on VM per HIPAA  

## 2014-12-14 ENCOUNTER — Other Ambulatory Visit: Payer: Self-pay | Admitting: Internal Medicine

## 2014-12-14 NOTE — Telephone Encounter (Signed)
Ok to phone in clonazepam 

## 2014-12-14 NOTE — Telephone Encounter (Signed)
Last filled 08/24/14--please advise

## 2014-12-15 NOTE — Telephone Encounter (Signed)
Rx called in to pharmacy. 

## 2015-03-18 ENCOUNTER — Other Ambulatory Visit: Payer: Self-pay | Admitting: Internal Medicine

## 2015-03-18 DIAGNOSIS — Z780 Asymptomatic menopausal state: Secondary | ICD-10-CM

## 2015-03-18 DIAGNOSIS — Z0000 Encounter for general adult medical examination without abnormal findings: Secondary | ICD-10-CM

## 2015-03-19 ENCOUNTER — Other Ambulatory Visit: Payer: Medicare HMO

## 2015-03-24 ENCOUNTER — Other Ambulatory Visit: Payer: Self-pay | Admitting: Internal Medicine

## 2015-03-24 DIAGNOSIS — I10 Essential (primary) hypertension: Secondary | ICD-10-CM

## 2015-03-24 DIAGNOSIS — Z13220 Encounter for screening for lipoid disorders: Secondary | ICD-10-CM

## 2015-03-24 DIAGNOSIS — Z13820 Encounter for screening for osteoporosis: Secondary | ICD-10-CM

## 2015-03-25 ENCOUNTER — Other Ambulatory Visit (INDEPENDENT_AMBULATORY_CARE_PROVIDER_SITE_OTHER): Payer: Medicare HMO

## 2015-03-25 DIAGNOSIS — Z13820 Encounter for screening for osteoporosis: Secondary | ICD-10-CM | POA: Diagnosis not present

## 2015-03-25 DIAGNOSIS — Z13220 Encounter for screening for lipoid disorders: Secondary | ICD-10-CM

## 2015-03-25 DIAGNOSIS — M859 Disorder of bone density and structure, unspecified: Secondary | ICD-10-CM | POA: Diagnosis not present

## 2015-03-25 DIAGNOSIS — I10 Essential (primary) hypertension: Secondary | ICD-10-CM | POA: Diagnosis not present

## 2015-03-25 LAB — CBC
HCT: 39.6 % (ref 36.0–46.0)
HEMOGLOBIN: 13.5 g/dL (ref 12.0–15.0)
MCHC: 34 g/dL (ref 30.0–36.0)
MCV: 91.9 fl (ref 78.0–100.0)
PLATELETS: 244 10*3/uL (ref 150.0–400.0)
RBC: 4.31 Mil/uL (ref 3.87–5.11)
RDW: 13.2 % (ref 11.5–15.5)
WBC: 4.1 10*3/uL (ref 4.0–10.5)

## 2015-03-25 LAB — COMPREHENSIVE METABOLIC PANEL
ALBUMIN: 4.5 g/dL (ref 3.5–5.2)
ALK PHOS: 48 U/L (ref 39–117)
ALT: 18 U/L (ref 0–35)
AST: 21 U/L (ref 0–37)
BUN: 22 mg/dL (ref 6–23)
CALCIUM: 9.8 mg/dL (ref 8.4–10.5)
CO2: 30 mEq/L (ref 19–32)
Chloride: 100 mEq/L (ref 96–112)
Creatinine, Ser: 1.02 mg/dL (ref 0.40–1.20)
GFR: 55.94 mL/min — ABNORMAL LOW (ref >60.00)
Glucose, Bld: 102 mg/dL — ABNORMAL HIGH (ref 70–99)
Potassium: 3.8 mEq/L (ref 3.5–5.1)
SODIUM: 138 meq/L (ref 135–145)
Total Bilirubin: 0.7 mg/dL (ref 0.2–1.2)
Total Protein: 7.3 g/dL (ref 6.0–8.3)

## 2015-03-25 LAB — LIPID PANEL
CHOL/HDL RATIO: 4
Cholesterol: 249 mg/dL — ABNORMAL HIGH (ref 0–200)
HDL: 70.8 mg/dL (ref >39.00)
LDL CALC: 153 mg/dL — AB (ref 0–99)
NonHDL: 178.2
Triglycerides: 127 mg/dL (ref 0.0–149.0)
VLDL: 25.4 mg/dL (ref 0.0–40.0)

## 2015-03-25 LAB — VITAMIN D 25 HYDROXY (VIT D DEFICIENCY, FRACTURES): VITD: 53.32 ng/mL (ref 30.00–100.00)

## 2015-03-26 ENCOUNTER — Encounter: Payer: Medicare HMO | Admitting: Internal Medicine

## 2015-03-29 ENCOUNTER — Other Ambulatory Visit: Payer: Self-pay | Admitting: Internal Medicine

## 2015-03-29 MED ORDER — SERTRALINE HCL 100 MG PO TABS: 100.0000 mg | ORAL_TABLET | Freq: Every day | ORAL | Status: DC

## 2015-04-01 ENCOUNTER — Encounter: Payer: Self-pay | Admitting: Internal Medicine

## 2015-04-01 ENCOUNTER — Ambulatory Visit (INDEPENDENT_AMBULATORY_CARE_PROVIDER_SITE_OTHER): Payer: Medicare HMO | Admitting: Internal Medicine

## 2015-04-01 ENCOUNTER — Ambulatory Visit (INDEPENDENT_AMBULATORY_CARE_PROVIDER_SITE_OTHER)
Admission: RE | Admit: 2015-04-01 | Discharge: 2015-04-01 | Disposition: A | Discharge status: Home / Self Care | Payer: Medicare HMO | Source: Ambulatory Visit | Attending: Internal Medicine | Admitting: Internal Medicine

## 2015-04-01 ENCOUNTER — Encounter: Payer: Self-pay | Admitting: Radiology

## 2015-04-01 VITALS — BP 128/70 | HR 69 | Temp 97.9°F | Ht 61.66 in | Wt 161.0 lb

## 2015-04-01 DIAGNOSIS — F418 Other specified anxiety disorders: Secondary | ICD-10-CM

## 2015-04-01 DIAGNOSIS — R0989 Other specified symptoms and signs involving the circulatory and respiratory systems: Secondary | ICD-10-CM

## 2015-04-01 DIAGNOSIS — Z23 Encounter for immunization: Secondary | ICD-10-CM

## 2015-04-01 DIAGNOSIS — I10 Essential (primary) hypertension: Secondary | ICD-10-CM | POA: Diagnosis not present

## 2015-04-01 DIAGNOSIS — M8580 Other specified disorders of bone density and structure, unspecified site: Secondary | ICD-10-CM

## 2015-04-01 DIAGNOSIS — Z0000 Encounter for general adult medical examination without abnormal findings: Secondary | ICD-10-CM

## 2015-04-01 DIAGNOSIS — L989 Disorder of the skin and subcutaneous tissue, unspecified: Secondary | ICD-10-CM

## 2015-04-01 DIAGNOSIS — F32A Depression, unspecified: Secondary | ICD-10-CM

## 2015-04-01 DIAGNOSIS — R0689 Other abnormalities of breathing: Secondary | ICD-10-CM

## 2015-04-01 DIAGNOSIS — F419 Anxiety disorder, unspecified: Secondary | ICD-10-CM

## 2015-04-01 DIAGNOSIS — E785 Hyperlipidemia, unspecified: Secondary | ICD-10-CM

## 2015-04-01 DIAGNOSIS — F329 Major depressive disorder, single episode, unspecified: Secondary | ICD-10-CM

## 2015-04-01 MED ORDER — CLONAZEPAM 0.5 MG PO TABS: 0.5000 mg | ORAL_TABLET | Freq: Every day | ORAL | Status: DC

## 2015-04-01 NOTE — Progress Notes (Signed)
Pre visit review using our clinic review tool, if applicable. No additional management support is needed unless otherwise documented below in the visit note. 

## 2015-04-01 NOTE — Progress Notes (Signed)
HPI:  Pt presents to the clinic today for her annual wellness visit. She is also due for 6 month follow up of chronic conditions.  Anxiety and Depression: She was switch from Zoloft to Celexa 03/2014. She did not return for her 4-6 week follow up. She reports that she stopped the Celexa after taking it for 1 month and went back to the Zoloft. She does feel like 50 mg of Zoloft is effective. She does take the Clonazepam about 1-2 times per week. She denies SI/HI.  HTN: Her BP is well controlled on Triamterene-HCTZ. BP today 128/70.  Osteopenia: Bone density exam from 06/2012 showed osteopenia. She is on calcium and Vit D supplement.  HLD: Lipid profile from 02/2015 reviewed. She is taking red yeast rice. She ran our of her fish oil and never picked it up. She has been on a statin in the past but did not like the side effect profile so she stopped taking.  Past Medical History  Diagnosis Date  . Depression   . Hypertension   . Arthritis   . Allergy     Current Outpatient Prescriptions  Medication Sig Dispense Refill  . aspirin 81 MG tablet Take 81 mg by mouth daily.      . Calcium Carbonate (CALCIUM 600 PO) Take 2 capsules by mouth daily.    . cholecalciferol (VITAMIN D) 1000 UNITS tablet Take 1,000 Units by mouth daily.    . clonazePAM (KLONOPIN) 0.5 MG tablet TAKE ONE TABLET BY MOUTH EVERY NIGHT AT BEDTIME 30 tablet 0  . fish oil-omega-3 fatty acids 1000 MG capsule Take 1,000 mg by mouth daily.      . Magnesium 500 MG CAPS Take 1 capsule by mouth daily.    . Multiple Vitamin (MULTIVITAMIN) capsule Take 1 capsule by mouth daily.      . Red Yeast Rice Extract (RED YEAST RICE PO) Take 1 capsule by mouth daily.    . sertraline (ZOLOFT) 50 MG tablet Take 1 tablet (50 mg total) by mouth daily. NEED TO SCHEDULE ANNUAL PHYSICAL FOR MORE REFILLS [phone] 90 tablet 0  . triamterene-hydrochlorothiazide (MAXZIDE) 75-50 MG per tablet Take 1 tablet by mouth daily. (Patient taking differently: Take  0.5 tablets by mouth daily. ) 90 tablet 1  . vitamin B-12 (CYANOCOBALAMIN) 1000 MCG tablet Take 1,000 mcg by mouth daily.    . sertraline (ZOLOFT) 100 MG tablet Take 1 tablet (100 mg total) by mouth daily. NEED TO SCHEDULE ANNUAL PHYSICAL FOR MORE REFILLS [phone] (Patient not taking: Reported on 04/01/2015) 90 tablet 0   No current facility-administered medications for this visit.    Allergies  Allergen Reactions  . Codeine Anxiety    Family History  Problem Relation Age of Onset  . Hypertension Sister   . Hypertension Brother   . Diabetes Brother   . Cancer Mother     CERVICAL  . Heart disease Mother   . Stroke Mother   . Hypertension Mother   . Heart disease Father   . Hypertension Father   . Diabetes Father     History   Social History  . Marital Status: Widowed    Spouse Name: N/A  . Number of Children: N/A  . Years of Education: N/A   Occupational History  . Not on file.   Social History Main Topics  . Smoking status: Never Smoker   . Smokeless tobacco: Never Used  . Alcohol Use: No  . Drug Use: Not on file  . Sexual Activity: No  Other Topics Concern  . Not on file   Social History Narrative    Hospitiliaztions: None  Health Maintenance:    Flu: 2015  Tetanus: 2012  Pneumovax: 2009  Prevnar: never  Zostavax: 2012  [facility]  Pap Smear: 2012, no longer wants to screen  Bone Density: 2013  Colonoscopy: 2013  Eye Doctor: yearly  Dental Exam: biannually  Providers:   PCP: Webb Silversmith, NP-C  Dermatologist: Dr. Allyson Sabal  I have personally reviewed and have noted:  1. The patient's medical and social history 2. Their use of alcohol, tobacco or illicit drugs 3. Their current medications and supplements 4. The patient's functional ability including ADL's, fall risks, home safety  risks and hearing or visual impairment. 5. Diet and physical activities 6. Evidence for depression or mood disorder  Subjective:    Review of Systems:   Constitutional: Denies fever, malaise, fatigue, headache or abrupt weight changes.  HEENT: Denies eye pain, eye redness, ear pain, ringing in the ears, wax buildup, runny nose, nasal congestion, bloody nose, or sore throat. Respiratory: Denies difficulty breathing, shortness of breath, cough or sputum production.   Cardiovascular: Denies chest pain, chest tightness, palpitations or swelling in the hands or feet.  Gastrointestinal: Denies abdominal pain, bloating, constipation, diarrhea or blood in the stool.  GU: Denies urgency, frequency, pain with urination, burning sensation, blood in urine, odor or discharge. Musculoskeletal: Denies decrease in range of motion, difficulty with gait, muscle pain or joint pain and swelling.  Skin: Pt reports rash on chest. Denies ulcercations.  Neurological: Denies dizziness, difficulty with memory, difficulty with speech or problems with balance and coordination.  Psych: Pt reports anxiety and depression. Denies SI/HI.  No other specific complaints in a complete review of systems (except as listed in HPI above).  Objective:  PE:   BP 128/70 mmHg  Pulse 69  Temp(Src) 97.9 F (36.6 C) (Oral)  Ht 5' 1.66" (1.566 m)  Wt 161 lb (73.029 kg)  BMI 29.78 kg/m2  SpO2 97% Wt Readings from Last 3 Encounters:  04/01/15 161 lb (73.029 kg)  08/24/14 160 lb (72.576 kg)  03/23/14 157 lb 8 oz (71.442 kg)    General: Appears her stated age, well developed, well nourished in NAD. Skin: Warm, dry and intact. 1 cm irregular scaly pigmented lesion noted to left upper chest. HEENT: Head: normal shape and size; Eyes: sclera white, no icterus, conjunctiva pink, PERRLA and EOMs intact; Ears: Tm's gray and intact, normal light reflex; Nose: mucosa pink and moist, septum midline; Throat/Mouth: Teeth present, mucosa pink and moist, no exudate, lesions or ulcerations noted.  Neck:  Neck supple, trachea midline. No masses, lumps or thyromegaly  present.  Cardiovascular: Normal rate and rhythm. S1,S2 noted.  No murmur, rubs or gallops noted. No JVD or BLE edema. No carotid bruits noted. Pulmonary/Chest: Normal effort and coarse vesicular breath sounds. No respiratory distress. No wheezes, rales or ronchi noted.  Abdomen: Soft and nontender. Normal bowel sounds, no bruits noted. No distention or masses noted. Liver, spleen and kidneys non palpable. Neurological: Alert and oriented.  Psychiatric: Mood and affect normal. Behavior is normal. Judgment and thought content normal.     BMET    Component Value Date/Time   NA 138 03/25/2015 0837   K 3.8 03/25/2015 0837   CL 100 03/25/2015 0837   CO2 30 03/25/2015 0837   GLUCOSE 102* 03/25/2015 0837   BUN 22 03/25/2015 0837   CREATININE 1.02 03/25/2015 0837   CALCIUM  9.8 03/25/2015 0837   GFRNONAA >60 02/14/2009 0450   GFRAA  02/14/2009 0450    >60        The eGFR has been calculated using the MDRD equation. This calculation has not been validated in all clinical situations. eGFR's persistently <60 mL/min signify possible Chronic Kidney Disease.    Lipid Panel     Component Value Date/Time   CHOL 249* 03/25/2015 0837   TRIG 127.0 03/25/2015 0837   HDL 70.80 03/25/2015 0837   CHOLHDL 4 03/25/2015 0837   VLDL 25.4 03/25/2015 0837   LDLCALC 153* 03/25/2015 0837    CBC    Component Value Date/Time   WBC 4.1 03/25/2015 0837   RBC 4.31 03/25/2015 0837   HGB 13.5 03/25/2015 0837   HCT 39.6 03/25/2015 0837   PLT 244.0 03/25/2015 0837   MCV 91.9 03/25/2015 0837   MCHC 34.0 03/25/2015 0837   RDW 13.2 03/25/2015 0837    Hgb A1C Lab Results  Component Value Date   HGBA1C 5.7 09/29/2013      Assessment and Plan:   Medicare Annual Wellness Visit:  Diet: She eats what she wants, some fruits and veggies, some fried foods. She drinks mostly water, diet coke and green tea. Physical activity: she goes to the [facility] x week for 1 hour Depression/mood screen: Chronic  but she is controlled on medications Hearing: Intact to whispered voice Visual acuity: Grossly normal, she wears contacts, performs annual eye exam  ADLs: Capable Fall risk: None Home safety: Good Cognitive evaluation: Intact to orientation, naming, recall and repetition EOL planning: Living will, full code/ I agree  Preventative Medicine: Prevnar today. She does not want me to set up her bone density exam. She declines pap smear today  Next appointment: 6 months to follow up chronic condtions  Abnormal breath sounds:  Chest xray  Today  Skin lesion of chest wall:  Follow up with dermatology

## 2015-04-01 NOTE — Assessment & Plan Note (Addendum)
No recent falls Vit D level from 1 week ago reviewd Continue the Vit D supplement She declines repeat bone density exam at this time

## 2015-04-01 NOTE — Assessment & Plan Note (Signed)
Stable on current dose of Zoloft CMET reviewed

## 2015-04-01 NOTE — Assessment & Plan Note (Signed)
LDL elevated but she is statin intolerant Continue red yeast rice Encouraged her to restart the fish oil Handout given on low fat diet CMET and Lipid profile from 1 week ago reviewed

## 2015-04-01 NOTE — Patient Instructions (Signed)

## 2015-04-01 NOTE — Addendum Note (Signed)
Addended by: Lurlean Nanny on: 04/01/2015 04:54 PM   Modules accepted: Orders

## 2015-04-01 NOTE — Assessment & Plan Note (Signed)
Well controlled on current meds ECG today normal CBC and CMET done 1 week ago reviewed

## 2015-04-22 ENCOUNTER — Encounter: Payer: Self-pay | Admitting: Internal Medicine

## 2015-06-09 ENCOUNTER — Encounter: Payer: Self-pay | Admitting: Gynecology

## 2015-06-10 ENCOUNTER — Encounter: Payer: Self-pay | Admitting: Gynecology

## 2015-07-27 ENCOUNTER — Ambulatory Visit (INDEPENDENT_AMBULATORY_CARE_PROVIDER_SITE_OTHER): Payer: Medicare HMO | Admitting: Internal Medicine

## 2015-07-27 ENCOUNTER — Encounter: Payer: Self-pay | Admitting: Internal Medicine

## 2015-07-27 ENCOUNTER — Other Ambulatory Visit: Payer: Self-pay | Admitting: Internal Medicine

## 2015-07-27 ENCOUNTER — Telehealth: Payer: Self-pay | Admitting: Internal Medicine

## 2015-07-27 VITALS — BP 128/68 | HR 73 | Wt 165.0 lb

## 2015-07-27 DIAGNOSIS — H01002 Unspecified blepharitis right lower eyelid: Secondary | ICD-10-CM | POA: Diagnosis not present

## 2015-07-27 MED ORDER — POLYMYXIN B-TRIMETHOPRIM 10000-0.1 UNIT/ML-% OP SOLN: 1.0000 [drp] | Freq: Four times a day (QID) | OPHTHALMIC | Status: DC

## 2015-07-27 MED ORDER — SULFACETAMIDE SODIUM 10 % OP OINT: TOPICAL_OINTMENT | Freq: Four times a day (QID) | OPHTHALMIC | Status: DC

## 2015-07-27 NOTE — Telephone Encounter (Signed)
I have corrected this and sent back to pharmacy

## 2015-07-27 NOTE — Assessment & Plan Note (Signed)
?  foreign body when doing leaves Early chalazion?? Will use antibiotic ointment/warm compresses To eye doctor if persists

## 2015-07-27 NOTE — Addendum Note (Signed)
Addended by: Despina Hidden on: 07/27/2015 12:31 PM   Modules accepted: Orders, Medications

## 2015-07-27 NOTE — Telephone Encounter (Signed)
Meagan called from [facility], they do not have sulfacetamide (BLEPH-10) 10 % ophthalmic ointment and do not know where to find.  Pt is at pharmacy now.  Please advise. [phone] [place]

## 2015-07-27 NOTE — Progress Notes (Signed)
Pre visit review using our clinic review tool, if applicable. No additional management support is needed unless otherwise documented below in the visit note. 

## 2015-07-27 NOTE — Patient Instructions (Signed)
Try warm compresses and the antibiotic ointment on your eye. If it is not better within 2 days, set up an appointment with your eye doctor.

## 2015-07-27 NOTE — Progress Notes (Signed)
   Subjective:    Patient ID: Monica Fuller, female    DOB: 06-Sep-1941, 74 y.o.   MRN: [id number]  HPI Here due to a problem with right eye  Was out doing leaves yesterday--noticed some irritation after Doesn't remember any trauma No apparent foreign body  Worse this AM Slight discharge  No fever No URI symptoms Vision is mostly about the same  Current Outpatient Prescriptions on File Prior to Visit  Medication Sig Dispense Refill  . aspirin 81 MG tablet Take 81 mg by mouth daily.      . Calcium Carbonate (CALCIUM 600 PO) Take 2 capsules by mouth daily.    . cholecalciferol (VITAMIN D) 1000 UNITS tablet Take 1,000 Units by mouth daily.    . clonazePAM (KLONOPIN) 0.5 MG tablet Take 1 tablet (0.5 mg total) by mouth at bedtime. 30 tablet 0  . fish oil-omega-3 fatty acids 1000 MG capsule Take 1,000 mg by mouth daily.      . Magnesium 500 MG CAPS Take 1 capsule by mouth daily.    . Multiple Vitamin (MULTIVITAMIN) capsule Take 1 capsule by mouth daily.      . Red Yeast Rice Extract (RED YEAST RICE PO) Take 1 capsule by mouth daily.    . sertraline (ZOLOFT) 50 MG tablet Take 1 tablet (50 mg total) by mouth daily. NEED TO SCHEDULE ANNUAL PHYSICAL FOR MORE REFILLS [phone] 90 tablet 0  . triamterene-hydrochlorothiazide (MAXZIDE) 75-50 MG per tablet Take 1 tablet by mouth daily. (Patient taking differently: Take 0.5 tablets by mouth daily. ) 90 tablet 1  . vitamin B-12 (CYANOCOBALAMIN) 1000 MCG tablet Take 1,000 mcg by mouth daily.     No current facility-administered medications on file prior to visit.    Allergies  Allergen Reactions  . Codeine Anxiety    Past Medical History  Diagnosis Date  . Depression   . Hypertension   . Arthritis   . Allergy     Past Surgical History  Procedure Laterality Date  . 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 Hypertension Sister   . Hypertension Brother   . Diabetes Brother   .  Cancer Mother     CERVICAL  . Heart disease Mother   . Stroke Mother   . Hypertension Mother   . Heart disease Father   . Hypertension Father   . Diabetes Father     Social History   Social History  . Marital Status: Widowed    Spouse Name: N/A  . Number of Children: N/A  . Years of Education: N/A   Occupational History  . Not on file.   Social History Main Topics  . Smoking status: Never Smoker   . Smokeless tobacco: Never Used  . Alcohol Use: No  . Drug Use: Not on file  . Sexual Activity: No   Other Topics Concern  . Not on file   Social History Narrative   Review of Systems Feels well otherwise No GI symptoms    Objective:   Physical Exam  Eyes:  Inflammation of lower lid Small white mass on lid---doesn't move with brushing with cotton applicator          Assessment & Plan:

## 2015-07-29 DIAGNOSIS — H00012 Hordeolum externum right lower eyelid: Secondary | ICD-10-CM | POA: Diagnosis not present

## 2015-09-16 DIAGNOSIS — R69 Illness, unspecified: Secondary | ICD-10-CM | POA: Diagnosis not present

## 2015-09-23 DIAGNOSIS — L218 Other seborrheic dermatitis: Secondary | ICD-10-CM | POA: Diagnosis not present

## 2015-09-23 DIAGNOSIS — L812 Freckles: Secondary | ICD-10-CM | POA: Diagnosis not present

## 2015-09-23 DIAGNOSIS — D1801 Hemangioma of skin and subcutaneous tissue: Secondary | ICD-10-CM | POA: Diagnosis not present

## 2015-09-23 DIAGNOSIS — L821 Other seborrheic keratosis: Secondary | ICD-10-CM | POA: Diagnosis not present

## 2015-10-04 ENCOUNTER — Encounter: Payer: Self-pay | Admitting: Internal Medicine

## 2015-10-04 ENCOUNTER — Ambulatory Visit (INDEPENDENT_AMBULATORY_CARE_PROVIDER_SITE_OTHER): Payer: Medicare HMO | Admitting: Internal Medicine

## 2015-10-04 VITALS — BP 126/82 | HR 73 | Temp 97.7°F | Wt 163.0 lb

## 2015-10-04 DIAGNOSIS — R300 Dysuria: Secondary | ICD-10-CM | POA: Diagnosis not present

## 2015-10-04 DIAGNOSIS — R5383 Other fatigue: Secondary | ICD-10-CM

## 2015-10-04 DIAGNOSIS — F418 Other specified anxiety disorders: Secondary | ICD-10-CM

## 2015-10-04 DIAGNOSIS — L853 Xerosis cutis: Secondary | ICD-10-CM

## 2015-10-04 DIAGNOSIS — I10 Essential (primary) hypertension: Secondary | ICD-10-CM | POA: Diagnosis not present

## 2015-10-04 DIAGNOSIS — F419 Anxiety disorder, unspecified: Secondary | ICD-10-CM

## 2015-10-04 DIAGNOSIS — R062 Wheezing: Secondary | ICD-10-CM

## 2015-10-04 DIAGNOSIS — E785 Hyperlipidemia, unspecified: Secondary | ICD-10-CM | POA: Diagnosis not present

## 2015-10-04 DIAGNOSIS — M8580 Other specified disorders of bone density and structure, unspecified site: Secondary | ICD-10-CM

## 2015-10-04 DIAGNOSIS — F329 Major depressive disorder, single episode, unspecified: Secondary | ICD-10-CM

## 2015-10-04 DIAGNOSIS — F32A Depression, unspecified: Secondary | ICD-10-CM

## 2015-10-04 LAB — LIPID PANEL
CHOLESTEROL: 243 mg/dL — AB (ref 0–200)
HDL: 83.5 mg/dL (ref >39.00)
LDL CALC: 138 mg/dL — AB (ref 0–99)
NonHDL: 159.4
Total CHOL/HDL Ratio: 3
Triglycerides: 108 mg/dL (ref 0.0–149.0)
VLDL: 21.6 mg/dL (ref 0.0–40.0)

## 2015-10-04 LAB — TSH: TSH: 2.36 u[IU]/mL (ref 0.35–4.50)

## 2015-10-04 LAB — POC URINALSYSI DIPSTICK (AUTOMATED)
BILIRUBIN UA: NEGATIVE
Blood, UA: NEGATIVE
GLUCOSE UA: NEGATIVE
Ketones, UA: NEGATIVE
NITRITE UA: NEGATIVE
Protein, UA: NEGATIVE
Spec Grav, UA: 1.015
Urobilinogen, UA: NEGATIVE
pH, UA: 7

## 2015-10-04 LAB — LDL CHOLESTEROL, DIRECT: Direct LDL: 144 mg/dL

## 2015-10-04 LAB — COMPREHENSIVE METABOLIC PANEL
ALBUMIN: 4.6 g/dL (ref 3.5–5.2)
ALT: 20 U/L (ref 0–35)
AST: 25 U/L (ref 0–37)
Alkaline Phosphatase: 56 U/L (ref 39–117)
BUN: 21 mg/dL (ref 6–23)
CALCIUM: 9.6 mg/dL (ref 8.4–10.5)
CO2: 26 meq/L (ref 19–32)
CREATININE: 1.02 mg/dL (ref 0.40–1.20)
Chloride: 100 mEq/L (ref 96–112)
GFR: 55.86 mL/min — ABNORMAL LOW (ref >60.00)
Glucose, Bld: 100 mg/dL — ABNORMAL HIGH (ref 70–99)
Potassium: 3.9 mEq/L (ref 3.5–5.1)
Sodium: 139 mEq/L (ref 135–145)
Total Bilirubin: 0.5 mg/dL (ref 0.2–1.2)
Total Protein: 7.4 g/dL (ref 6.0–8.3)

## 2015-10-04 LAB — VITAMIN D 25 HYDROXY (VIT D DEFICIENCY, FRACTURES): VITD: 52.96 ng/mL (ref 30.00–100.00)

## 2015-10-04 MED ORDER — TRIAMTERENE-HCTZ 75-50 MG PO TABS: 1.0000 | ORAL_TABLET | Freq: Every day | ORAL | Status: DC

## 2015-10-04 MED ORDER — CLONAZEPAM 0.5 MG PO TABS: 0.5000 mg | ORAL_TABLET | Freq: Every day | ORAL | Status: DC

## 2015-10-04 MED ORDER — SERTRALINE HCL 100 MG PO TABS: 100.0000 mg | ORAL_TABLET | Freq: Every day | ORAL | Status: DC

## 2015-10-04 NOTE — Progress Notes (Signed)
Subjective:    Patient ID: Monica Fuller, female    DOB: 1941/08/10, 75 y.o.   MRN: [id number]  HPI Pt is here for a 6 month follow up of chronic conditions.   Anxiety and Depression: Pt is currently taking 100 mg of Zoloft daily. She cut down to half a tablet for several months but had instances of increased stress so she increased back to a full tablet about one month ago. She says the anxiety/ depression is managed well. She does take the Clonazepam about 1-2 times per week. She denies SI/HI.  HTN: Her BP is well controlled on Triamterene-HCTZ. BP today 126/82. Occasional muscle aches.   Osteopenia: Bone density exam from 06/2012 showed osteopenia. She is on calcium and Vit D supplement.  HLD: Lipid profile from 03/2015 reviewed. She is taking red yeast rice. Patient has started back on Fish Oil 1052m daily.  She has been on a statin in the past but did not like the side effect profile so she stopped taking.  Pt expresses burning with urination. Denies urgency and frequency. Complains urinary stream is slow and dribbling. Pt says she has some stress incontinence with coughing. At night she is getting up to go to the bathroom. Has not tried any treatment. Denies urgency, frequency, irritation of the external genitalia and pain in lower back or abdomen.   Wonders about her thyroid, she feels like it is swollen. Pt notes fatigue, dry skin and weight gain. Denies pain in neck and constipation. Would like her Thyroid levels checked.   Patient has noticed wheezing when active and at rest. Denies shortness of breath and chest pain.    Review of Systems  Past Medical History  Diagnosis Date  . Depression   . Hypertension   . Arthritis   . Allergy     Current Outpatient Prescriptions  Medication Sig Dispense Refill  . aspirin 81 MG tablet Take 81 mg by mouth daily.      . Calcium Carbonate (CALCIUM 600 PO) Take 2 capsules by mouth daily.    . cholecalciferol (VITAMIN D) 1000 UNITS  tablet Take 1,000 Units by mouth daily.    . clonazePAM (KLONOPIN) 0.5 MG tablet Take 1 tablet (0.5 mg total) by mouth at bedtime. 30 tablet 0  . fish oil-omega-3 fatty acids 1000 MG capsule Take 1,000 mg by mouth daily.      . Magnesium 500 MG CAPS Take 1 capsule by mouth daily.    . Multiple Vitamin (MULTIVITAMIN) capsule Take 1 capsule by mouth daily.      . Red Yeast Rice Extract (RED YEAST RICE PO) Take 1 capsule by mouth daily.    . sertraline (ZOLOFT) 50 MG tablet Take 1 tablet (50 mg total) by mouth daily. NEED TO SCHEDULE ANNUAL PHYSICAL FOR MORE REFILLS [phone] 0  . triamterene-hydrochlorothiazide (MAXZIDE) 75-50 MG tablet TAKE 1 TABLET BY MOUTH DAILY 90 tablet 0  . trimethoprim-polymyxin b (POLYTRIM) ophthalmic solution Place 1 drop into the right eye 4 (four) times daily. 10 mL 0  . vitamin B-12 (CYANOCOBALAMIN) 1000 MCG tablet Take 1,000 mcg by mouth daily.     No current facility-administered medications for this visit.    Allergies  Allergen Reactions  . Codeine Anxiety    Family History  Problem Relation Age of Onset  . Hypertension Sister   . Hypertension Brother   . Diabetes Brother   . Cancer Mother     CERVICAL  . Heart disease Mother   .  Stroke Mother   . Hypertension Mother   . Heart disease Father   . Hypertension Father   . Diabetes Father     Social History   Social History  . Marital Status: Widowed    Spouse Name: N/A  . Number of Children: N/A  . Years of Education: N/A   Occupational History  . Not on file.   Social History Main Topics  . Smoking status: Never Smoker   . Smokeless tobacco: Never Used  . Alcohol Use: No  . Drug Use: Not on file  . Sexual Activity: No   Other Topics Concern  . Not on file   Social History Narrative     Constitutional: Positive fatigue and weight changes. Denies fever, malaise, and headache. Respiratory: Positive occasional wheezing. Denies difficulty breathing, shortness of breath,  cough or sputum production.   Cardiovascular: Denies chest pain, chest tightness, palpitations or swelling in the hands or feet.  GU: Positive pain with urination, burning sensation. Denies urgency, frequency, blood in urine, odor or discharge. Musculoskeletal: Occasional muscle cramps. Denies decrease in range of motion, difficulty with gait,  or joint pain and swelling.  Skin: Positive dry skin. Denies redness, rashes, lesions or ulcercations.  Psych: Positive anxiety and depression. Denies SI/HI.  No other specific complaints in a complete review of systems (except as listed in HPI above).     Objective:   Physical Exam BP 126/82 mmHg  Pulse 73  Temp(Src) 97.7 F (36.5 C) (Oral)  Wt 163 lb (73.936 kg)  SpO2 98% Wt Readings from Last 3 Encounters:  10/04/15 163 lb (73.936 kg)  07/27/15 165 lb (74.844 kg)  04/01/15 161 lb (73.029 kg)    General: Appears her stated age, in NAD. Skin: Warm, dry and intact. No rashes, lesions or ulcerations noted. Neck:  Neck supple, trachea midline. No masses, lumps or thyromegaly present.  Cardiovascular: Normal rate and rhythm. S1,S2 noted.  No murmur, rubs or gallops noted. No carotid bruits noted. Pulmonary/Chest: Normal effort and positive vesicular breath sounds. No respiratory distress. No wheezes, rales or ronchi noted.  Neurological: Alert and oriented.   Psychiatric: Mood and affect normal. Behavior is normal. Judgment and thought content normal.   EKG:  BMET    Component Value Date/Time   NA 138 03/25/2015 0837   K 3.8 03/25/2015 0837   CL 100 03/25/2015 0837   CO2 30 03/25/2015 0837   GLUCOSE 102* 03/25/2015 0837   BUN 22 03/25/2015 0837   CREATININE 1.02 03/25/2015 0837   CALCIUM 9.8 03/25/2015 0837   GFRNONAA >60 02/14/2009 0450   GFRAA  02/14/2009 0450    >60        The eGFR has been calculated using the MDRD equation. This calculation has not been validated in all clinical situations. eGFR's persistently <60 mL/min  signify possible Chronic Kidney Disease.    Lipid Panel     Component Value Date/Time   CHOL 249* 03/25/2015 0837   TRIG 127.0 03/25/2015 0837   HDL 70.80 03/25/2015 0837   CHOLHDL 4 03/25/2015 0837   VLDL 25.4 03/25/2015 0837   LDLCALC 153* 03/25/2015 0837    CBC    Component Value Date/Time   WBC 4.1 03/25/2015 0837   RBC 4.31 03/25/2015 0837   HGB 13.5 03/25/2015 0837   HCT 39.6 03/25/2015 0837   PLT 244.0 03/25/2015 0837   MCV 91.9 03/25/2015 0837   MCHC 34.0 03/25/2015 0837   RDW 13.2 03/25/2015 0837    Hgb A1C  Lab Results  Component Value Date   HGBA1C 5.7 09/29/2013          Assessment & Plan:  Anxiety and Depression: Continue taking Zoloft 100 mg daily, will change current prescription to 12m tablet  HTN: Continue Triamterne-HCTZ Muscle aches might be diuretic induced hypokalemia, will check CMET today If potassium low, start potassium supplementation  Osteopenia: Continue Vit D and Calcium Vit D level drawn today, will be adjusted if needed  HLD: Pt given handout on a low fat diet Continue Fish Oil 1029mdaily and Red Yeast Rice Pt counseled on restarting a statin and denied this treatment plan Obtained Lipid profile today   Dysuria: Urinanalysis shows trace leuks, will send urine for culture Push fluids No abx if urine culture is negative  Wheezing: No wheezing on physical exam, denies chest pain and SOB  Monitor and RTC if issue worsens or SOB/chest pain begins  Fatigue, dry skin and weight gain: May be due to thyroid levels, will check TSH levels today Weight is actually up 1lb Skin moisturizer twice daily If TSH is elevated with recheck TSH and check T4 in 4 weeks

## 2015-10-04 NOTE — Progress Notes (Signed)
HPI:  Pt presents to the clinic today for 6 month follow up of chronic conditions.  Anxiety and Depression: She was cutting her Zoloft 50 mg tabs in half, but reports now she is taking a whole pill. She does feel like it is effective. She does take the Clonazepam about 1-2 times per week. She denies SI/HI.  HTN: Her BP is well controlled on Triamterene-HCTZ. BP today 126/82. She has occasionally cramping in her legs. ECG from 03/2015 reviewed.  Osteopenia: Bone density exam from 06/2012 showed osteopenia. She is on calcium and Vit D supplement.  HLD: Lipid profile from 03/2015 reviewed. LDL 153.  She is taking Red Yeast Rice and Fish Oil. She has been on a statin in the past but did not like the side effect profile so she stopped taking. She has been trying to consume a low fat diet.  She also c/o dysuria. This started 1 week ago. She denies of urgency and frequency. She has noticed hesitancy, nocturia, and dribbling. She reports new onset stress incontinence with coughing or sneezing. She denies fever, chills, low back pain.   She would like thyroid checked today. She feels like it may be swollen. She denies pain in the area. She has noticed fatigue, weight gain and dry skin. She denies constipation or worsening depression.  She also feel like she is wheezing. This can occur with rest or activity. She denies shortness of breath or chest pain. She denies runny nose, nasal congestion, sore throat or cough. She has not tried anything OTC.   Past Medical History  Diagnosis Date  . Depression   . Hypertension   . Arthritis   . Allergy     Current Outpatient Prescriptions  Medication Sig Dispense Refill  . aspirin 81 MG tablet Take 81 mg by mouth daily.      . Calcium Carbonate (CALCIUM 600 PO) Take 2 capsules by mouth daily.    . cholecalciferol (VITAMIN D) 1000 UNITS tablet Take 1,000 Units by mouth daily.    . clonazePAM (KLONOPIN) 0.5 MG tablet Take 1 tablet (0.5 mg total) by mouth at  bedtime. 30 tablet 0  . fish oil-omega-3 fatty acids 1000 MG capsule Take 1,000 mg by mouth daily.      . Magnesium 500 MG CAPS Take 1 capsule by mouth daily.    . Multiple Vitamin (MULTIVITAMIN) capsule Take 1 capsule by mouth daily.      . Red Yeast Rice Extract (RED YEAST RICE PO) Take 1 capsule by mouth daily.    . sertraline (ZOLOFT) 50 MG tablet Take 1 tablet (50 mg total) by mouth daily. NEED TO SCHEDULE ANNUAL PHYSICAL FOR MORE REFILLS [phone] 90 tablet 0  . triamterene-hydrochlorothiazide (MAXZIDE) 75-50 MG tablet TAKE 1 TABLET BY MOUTH DAILY 90 tablet 0  . trimethoprim-polymyxin b (POLYTRIM) ophthalmic solution Place 1 drop into the right eye 4 (four) times daily. 10 mL 0  . vitamin B-12 (CYANOCOBALAMIN) 1000 MCG tablet Take 1,000 mcg by mouth daily.     No current facility-administered medications for this visit.    Allergies  Allergen Reactions  . Codeine Anxiety    Family History  Problem Relation Age of Onset  . Hypertension Sister   . Hypertension Brother   . Diabetes Brother   . Cancer Mother     CERVICAL  . Heart disease Mother   . Stroke Mother   . Hypertension Mother   . Heart disease Father   . Hypertension Father   . Diabetes  Father     Social History   Social History  . Marital Status: Widowed    Spouse Name: N/A  . Number of Children: N/A  . Years of Education: N/A   Occupational History  . Not on file.   Social History Main Topics  . Smoking status: Never Smoker   . Smokeless tobacco: Never Used  . Alcohol Use: No  . Drug Use: Not on file  . Sexual Activity: No   Other Topics Concern  . Not on file   Social History Narrative    Subjective:   Review of Systems:   Constitutional: Pt reports fatigue, weight gain. Denies fever, malaise, headache.  Respiratory: Pt reports wheezing. Denies difficulty breathing, shortness of breath, cough or sputum production.   Cardiovascular: Denies chest pain, chest tightness, palpitations or  swelling in the hands or feet.  Gastrointestinal: Denies abdominal pain, bloating, constipation, diarrhea or blood in the stool.  GU: Pt reports dysuria. Denies urgency, frequency, blood in urine, odor or discharge. Musculoskeletal: Pt reports muscle cramps in legs. Denies decrease in range of motion, difficulty with gait, or joint pain and swelling.  Skin: Denies redness, rashes, lesions or ulcercations.  Neurological: Denies dizziness, difficulty with memory, difficulty with speech or problems with balance and coordination.  Psych: Pt reports anxiety and depression. Denies SI/HI.  No other specific complaints in a complete review of systems (except as listed in HPI above).  Objective:  PE:   BP 126/82 mmHg  Pulse 73  Temp(Src) 97.7 F (36.5 C) (Oral)  Wt 163 lb (73.936 kg)  SpO2 98% Wt Readings from Last 3 Encounters:  10/04/15 163 lb (73.936 kg)  07/27/15 165 lb (74.844 kg)  04/01/15 161 lb (73.029 kg)    General: Appears her stated age, well developed, well nourished in NAD. Neck:  Neck supple, trachea midline. No masses, lumps or thyromegaly present.  Cardiovascular: Normal rate and rhythm. S1,S2 noted.  No murmur, rubs or gallops noted. No JVD or BLE edema. No carotid bruits noted. Pulmonary/Chest: Normal effort and coarse vesicular breath sounds. No respiratory distress. No wheezes, rales or ronchi noted.  MSK: No difficulty with gait. Neurological: Alert and oriented.  Psychiatric: Mood and affect normal. Behavior is normal. Judgment and thought content normal.     BMET    Component Value Date/Time   NA 138 03/25/2015 0837   K 3.8 03/25/2015 0837   CL 100 03/25/2015 0837   CO2 30 03/25/2015 0837   GLUCOSE 102* 03/25/2015 0837   BUN 22 03/25/2015 0837   CREATININE 1.02 03/25/2015 0837   CALCIUM 9.8 03/25/2015 0837   GFRNONAA >60 02/14/2009 0450   GFRAA  02/14/2009 0450    >60        The eGFR has been calculated using the MDRD equation. This calculation has  not been validated in all clinical situations. eGFR's persistently <60 mL/min signify possible Chronic Kidney Disease.    Lipid Panel     Component Value Date/Time   CHOL 249* 03/25/2015 0837   TRIG 127.0 03/25/2015 0837   HDL 70.80 03/25/2015 0837   CHOLHDL 4 03/25/2015 0837   VLDL 25.4 03/25/2015 0837   LDLCALC 153* 03/25/2015 0837    CBC    Component Value Date/Time   WBC 4.1 03/25/2015 0837   RBC 4.31 03/25/2015 0837   HGB 13.5 03/25/2015 0837   HCT 39.6 03/25/2015 0837   PLT 244.0 03/25/2015 0837   MCV 91.9 03/25/2015 0837   MCHC 34.0 03/25/2015 0837  RDW 13.2 03/25/2015 0837    Hgb A1C Lab Results  Component Value Date   HGBA1C 5.7 09/29/2013      Assessment and Plan:   HTN:  BP controlled on Trimaterene-HCT Will check CMET today  Osteopenia:  Will check Vit D level today Will repeat bone density in 6 months  Anxiety and Depression:  Stable on Zoloft 100 mg daily Support offered today Clonazepam refilled today  HLD:  LDL not at goal but pt refusing statin Will recheck Lipid Profile and CMET today Encouraged her to consume a low fat diet Encouraged her to continue Red Yeast Rice, Fish Oil and Baby ASA  Dysuria:  Urinalysis: trace leuks Will send urine culture No abx if culture negative Push fluids  Fatigue, weight gain and dry skin:  Will check TSH today Weight is actually down 1 lb since last visit Use a moisturizing lotion twice daily  Wheezing:  No SOB or CP Not apparent today Will continue to monitor for now She will let me know if persist or worsens  RTC in 6 months for wellness exam/follow up

## 2015-10-04 NOTE — Addendum Note (Signed)
Addended by: Lurlean Nanny on: 10/04/2015 01:47 PM   Modules accepted: Orders

## 2015-10-04 NOTE — Patient Instructions (Signed)

## 2015-10-04 NOTE — Progress Notes (Signed)
Pre visit review using our clinic review tool, if applicable. No additional management support is needed unless otherwise documented below in the visit note. 

## 2015-10-05 LAB — URINE CULTURE

## 2015-10-19 ENCOUNTER — Other Ambulatory Visit: Payer: Self-pay

## 2015-10-19 DIAGNOSIS — Z1231 Encounter for screening mammogram for malignant neoplasm of breast: Secondary | ICD-10-CM

## 2015-11-17 DIAGNOSIS — M67911 Unspecified disorder of synovium and tendon, right shoulder: Secondary | ICD-10-CM | POA: Diagnosis not present

## 2015-11-22 ENCOUNTER — Ambulatory Visit
Admission: RE | Admit: 2015-11-22 | Discharge: 2015-11-22 | Disposition: A | Discharge status: Home / Self Care | Payer: Medicare HMO | Source: Ambulatory Visit

## 2015-11-22 DIAGNOSIS — Z1231 Encounter for screening mammogram for malignant neoplasm of breast: Secondary | ICD-10-CM | POA: Diagnosis not present

## 2015-11-22 DIAGNOSIS — M7541 Impingement syndrome of right shoulder: Secondary | ICD-10-CM | POA: Diagnosis not present

## 2016-02-02 ENCOUNTER — Ambulatory Visit (INDEPENDENT_AMBULATORY_CARE_PROVIDER_SITE_OTHER): Payer: Medicare HMO | Admitting: Internal Medicine

## 2016-02-02 ENCOUNTER — Institutional Professional Consult (permissible substitution): Payer: Medicare HMO | Admitting: Primary Care

## 2016-02-02 ENCOUNTER — Encounter: Payer: Self-pay | Admitting: Internal Medicine

## 2016-02-02 VITALS — BP 126/72 | HR 72 | Temp 97.8°F | Wt 159.0 lb

## 2016-02-02 DIAGNOSIS — R319 Hematuria, unspecified: Secondary | ICD-10-CM

## 2016-02-02 DIAGNOSIS — R140 Abdominal distension (gaseous): Secondary | ICD-10-CM

## 2016-02-02 LAB — POCT URINALYSIS DIPSTICK
BILIRUBIN UA: NEGATIVE
Blood, UA: NEGATIVE
GLUCOSE UA: NEGATIVE
Ketones, UA: NEGATIVE
NITRITE UA: NEGATIVE
Protein, UA: NEGATIVE
Spec Grav, UA: 1.015
Urobilinogen, UA: NEGATIVE
pH, UA: 7.5

## 2016-02-02 NOTE — Addendum Note (Signed)
Addended by: Lurlean Nanny on: 02/02/2016 02:49 PM   Modules accepted: Orders, SmartSet

## 2016-02-02 NOTE — Progress Notes (Signed)
HPI  Pt presents to the clinic today with c/o blood in her urine and lower abdominal pain. This started last night. She noticed blood in the toilet (no BM). This morning, she noticed it again, after having a BM. She describes the abdominal pain as achy and bloating. Her bowels are moving normally. She denies constipation or diarrhea. She denies fever, chills, nausea or low back pain. She has not tried anything OTC for this. She takes a baby ASA daily. She does not use NSAIDS on a regular basis. She does have a history of hemorrhoids but has not noticed this lately.   Review of Systems  Past Medical History  Diagnosis Date  . Depression   . Hypertension   . Arthritis   . Allergy     Family History  Problem Relation Age of Onset  . Hypertension Sister   . Hypertension Brother   . Diabetes Brother   . Cancer Mother     CERVICAL  . Heart disease Mother   . Stroke Mother   . Hypertension Mother   . Heart disease Father   . Hypertension Father   . Diabetes Father     Social History   Social History  . Marital Status: Widowed    Spouse Name: N/A  . Number of Children: N/A  . Years of Education: N/A   Occupational History  . Not on file.   Social History Main Topics  . Smoking status: Never Smoker   . Smokeless tobacco: Never Used  . Alcohol Use: No  . Drug Use: Not on file  . Sexual Activity: No   Other Topics Concern  . Not on file   Social History Narrative    Allergies  Allergen Reactions  . Codeine Anxiety    Constitutional: Denies fever, malaise, fatigue, headache or abrupt weight changes. equency and pain with urination  Gastrointestinal: Denies nausea, vomiting, diarrhea, constipation or blood in her stool. GU: Pt reports abdominal bloating and blood in her urine. Denies urgency, frequency, dysuria, burning sensation, odor or discharge. Skin: Denies redness, rashes, lesions or ulcercations.   No other specific complaints in a complete review of systems  (except as listed in HPI above).    Objective:   Physical Exam  BP 126/72 mmHg  Pulse 72  Temp(Src) 97.8 F (36.6 C) (Oral)  Wt 159 lb (72.122 kg)  SpO2 97% Wt Readings from Last 3 Encounters:  02/02/16 159 lb (72.122 kg)  10/04/15 163 lb (73.936 kg)  07/27/15 165 lb (74.844 kg)    General: Appears her stated age, well developed, well nourished in NAD. Cardiovascular: Normal rate and rhythm. S1,S2 noted.   Pulmonary/Chest: Normal effort and positive vesicular breath sounds. No respiratory distress. No wheezes, rales or ronchi noted.  Abdomen: Soft and notender. Normal bowel sounds. No distention or masses noted.  No CVA tenderness. Pelvic: Normal female anatomy. No urethral irritation noted. No bloody discharge noted in the vaginal vault. Rectal: Normal rectal tone. No hemorrhoid noted. No blood noted in the rectal vault.     Assessment & Plan:   Abdominal bloating and blood in the urine:  Urinalysis: trace leuks Will send urine culture Hemoccult negative In the meantime, drink plenty of fluids and monitor symptoms for reoccurance  RTC as needed or if symptoms persist.

## 2016-02-02 NOTE — Progress Notes (Signed)
Pre visit review using our clinic review tool, if applicable. No additional management support is needed unless otherwise documented below in the visit note. 

## 2016-02-02 NOTE — Patient Instructions (Signed)

## 2016-02-04 LAB — URINE CULTURE: Colony Count: 15000

## 2016-04-03 ENCOUNTER — Encounter: Payer: Medicare HMO | Admitting: Internal Medicine

## 2016-04-06 ENCOUNTER — Encounter: Payer: Medicare HMO | Admitting: Internal Medicine

## 2016-04-10 ENCOUNTER — Ambulatory Visit (INDEPENDENT_AMBULATORY_CARE_PROVIDER_SITE_OTHER): Payer: Medicare HMO | Admitting: Internal Medicine

## 2016-04-10 ENCOUNTER — Encounter: Payer: Self-pay | Admitting: Internal Medicine

## 2016-04-10 VITALS — BP 124/74 | HR 73 | Temp 97.9°F | Ht 62.0 in | Wt 156.0 lb

## 2016-04-10 DIAGNOSIS — F418 Other specified anxiety disorders: Secondary | ICD-10-CM | POA: Diagnosis not present

## 2016-04-10 DIAGNOSIS — I10 Essential (primary) hypertension: Secondary | ICD-10-CM | POA: Diagnosis not present

## 2016-04-10 DIAGNOSIS — E785 Hyperlipidemia, unspecified: Secondary | ICD-10-CM | POA: Diagnosis not present

## 2016-04-10 DIAGNOSIS — R69 Illness, unspecified: Secondary | ICD-10-CM | POA: Diagnosis not present

## 2016-04-10 DIAGNOSIS — F419 Anxiety disorder, unspecified: Secondary | ICD-10-CM

## 2016-04-10 DIAGNOSIS — Z0000 Encounter for general adult medical examination without abnormal findings: Secondary | ICD-10-CM | POA: Diagnosis not present

## 2016-04-10 DIAGNOSIS — F329 Major depressive disorder, single episode, unspecified: Secondary | ICD-10-CM

## 2016-04-10 DIAGNOSIS — M8580 Other specified disorders of bone density and structure, unspecified site: Secondary | ICD-10-CM

## 2016-04-10 DIAGNOSIS — F32A Depression, unspecified: Secondary | ICD-10-CM

## 2016-04-10 LAB — COMPREHENSIVE METABOLIC PANEL
ALK PHOS: 53 U/L (ref 39–117)
ALT: 20 U/L (ref 0–35)
AST: 24 U/L (ref 0–37)
Albumin: 4.4 g/dL (ref 3.5–5.2)
BILIRUBIN TOTAL: 0.5 mg/dL (ref 0.2–1.2)
BUN: 24 mg/dL — ABNORMAL HIGH (ref 6–23)
CALCIUM: 9.9 mg/dL (ref 8.4–10.5)
CO2: 30 mEq/L (ref 19–32)
Chloride: 100 mEq/L (ref 96–112)
Creatinine, Ser: 0.96 mg/dL (ref 0.40–1.20)
GFR: 59.82 mL/min — AB (ref >60.00)
Glucose, Bld: 99 mg/dL (ref 70–99)
Potassium: 3.8 mEq/L (ref 3.5–5.1)
Sodium: 139 mEq/L (ref 135–145)
TOTAL PROTEIN: 7.2 g/dL (ref 6.0–8.3)

## 2016-04-10 LAB — LIPID PANEL
Cholesterol: 234 mg/dL — ABNORMAL HIGH (ref 0–200)
HDL: 66.7 mg/dL (ref >39.00)
LDL Cholesterol: 146 mg/dL — ABNORMAL HIGH (ref 0–99)
NonHDL: 167.1
TRIGLYCERIDES: 108 mg/dL (ref 0.0–149.0)
Total CHOL/HDL Ratio: 4
VLDL: 21.6 mg/dL (ref 0.0–40.0)

## 2016-04-10 LAB — CBC
HCT: 39.1 % (ref 36.0–46.0)
Hemoglobin: 13.2 g/dL (ref 12.0–15.0)
MCHC: 33.7 g/dL (ref 30.0–36.0)
MCV: 89.5 fl (ref 78.0–100.0)
PLATELETS: 256 10*3/uL (ref 150.0–400.0)
RBC: 4.37 Mil/uL (ref 3.87–5.11)
RDW: 14.3 % (ref 11.5–15.5)
WBC: 5.3 10*3/uL (ref 4.0–10.5)

## 2016-04-10 LAB — VITAMIN D 25 HYDROXY (VIT D DEFICIENCY, FRACTURES): VITD: 59.63 ng/mL (ref 30.00–100.00)

## 2016-04-10 MED ORDER — TRIAMTERENE-HCTZ 75-50 MG PO TABS: 1.0000 | ORAL_TABLET | Freq: Every day | ORAL | 1 refills | Status: DC

## 2016-04-10 MED ORDER — SERTRALINE HCL 100 MG PO TABS: 150.0000 mg | ORAL_TABLET | Freq: Every day | ORAL | 5 refills | Status: DC

## 2016-04-10 NOTE — Patient Instructions (Signed)

## 2016-04-10 NOTE — Progress Notes (Signed)
Pre visit review using our clinic review tool, if applicable. No additional management support is needed unless otherwise documented below in the visit note. 

## 2016-04-10 NOTE — Assessment & Plan Note (Signed)
Will follow bone density exams Continue Calcium and Vit D supplement Vit D level today

## 2016-04-10 NOTE — Assessment & Plan Note (Addendum)
Controlled on Maxzide CMET today

## 2016-04-10 NOTE — Assessment & Plan Note (Signed)
Encouraged her to consume a low fat diet Lipid Profile today Continue daily ASA, Red Yeast Rice and Fish Oil

## 2016-04-10 NOTE — Addendum Note (Signed)
Addended by: Ellamae Sia on: 04/10/2016 08:46 AM   Modules accepted: Miquel Dunn

## 2016-04-10 NOTE — Assessment & Plan Note (Signed)
Deteriorated Increase Zoloft to 150 mg daily Continue Clonazepam prn

## 2016-04-10 NOTE — Progress Notes (Signed)
HPI:  Pt presents to the clinic today for her Medicare Wellness Exam. She is also due for follow up of chronic conditions.  Anxiety and Depression: She is taking Zoloft 100 mg as presricbed. She does not feel like it is effective as it used to be.  She reports she gets bored easily, and feels lonely.  She denies crying spells.  She does take half of a Clonazepam about 1 times per week. She denies SI/HI.   HTN: Her BP is well controlled on Triamterene-HCTZ. BP today 124/74. She has occasionally cramping in her legs. ECG from 03/2015 reviewed.   Osteopenia: Bone density exam from 2015 showed osteopenia. She is on Calcium and Vit D supplement.   HLD: Lipid profile from 09/2015 reviewed. LDL 144.  She is taking Red Yeast Rice and Fish Oil. She has been on a statin in the past but did not like the side effect profile so she stopped taking. She has been trying to consume a low fat diet.  Past Medical History:  Diagnosis Date  . Allergy   . Arthritis   . Depression   . Hypertension     Current Outpatient Prescriptions  Medication Sig Dispense Refill  . aspirin 81 MG tablet Take 81 mg by mouth daily.      . Calcium Carbonate (CALCIUM 600 PO) Take 2 capsules by mouth daily.    . cholecalciferol (VITAMIN D) 1000 UNITS tablet Take 1,000 Units by mouth daily.    . clonazePAM (KLONOPIN) 0.5 MG tablet Take 1 tablet (0.5 mg total) by mouth at bedtime. 30 tablet 0  . fish oil-omega-3 fatty acids 1000 MG capsule Take 1,000 mg by mouth daily.      . Magnesium 500 MG CAPS Take 1 capsule by mouth daily.    . Multiple Vitamin (MULTIVITAMIN) capsule Take 1 capsule by mouth daily.      . Red Yeast Rice Extract (RED YEAST RICE PO) Take 1 capsule by mouth daily.    . sertraline (ZOLOFT) 100 MG tablet Take 1 tablet (100 mg total) by mouth daily. 30 tablet 3  . triamterene-hydrochlorothiazide (MAXZIDE) 75-50 MG tablet Take 1 tablet by mouth daily. 90 tablet 1  . trimethoprim-polymyxin b (POLYTRIM) ophthalmic  solution Place 1 drop into the right eye 4 (four) times daily. 10 mL 0  . vitamin B-12 (CYANOCOBALAMIN) 1000 MCG tablet Take 1,000 mcg by mouth daily.     No current facility-administered medications for this visit.     Allergies  Allergen Reactions  . Codeine Anxiety    Family History  Problem Relation Age of Onset  . Hypertension Sister   . Hypertension Brother   . Diabetes Brother   . Cancer Mother     CERVICAL  . Heart disease Mother   . Stroke Mother   . Hypertension Mother   . Heart disease Father   . Hypertension Father   . Diabetes Father     Social History   Social History  . Marital status: Widowed    Spouse name: N/A  . Number of children: N/A  . Years of education: N/A   Occupational History  . Not on file.   Social History Main Topics  . Smoking status: Never Smoker  . Smokeless tobacco: Never Used  . Alcohol use No  . Drug use: Unknown  . Sexual activity: No   Other Topics Concern  . Not on file   Social History Narrative  . No narrative on file    Hospitiliaztions:  None  Health Maintenance:    Flu: 06/2015  Tetanus: 04/2011  Pneumovax: 04/2008  Prevnar: 03/2015  Zostavax: 04/2011  Mammogram: 11/2015  Pap Smear: 03/2011  Bone Density: 08/2014  Colon Screening: 03/2012  Eye Doctor: 03/2015, scheduled for later this week  Dental Exam: every 6 months   Providers:   PCP: Webb Silversmith, NP-C             Dermatologist: Dr. Allyson Sabal    I have personally reviewed and have noted:  1. The patient's medical and social history 2. Their use of alcohol, tobacco or illicit drugs 3. Their current medications and supplements 4. The patient's functional ability including ADL's, fall risks, home  safety risks and hearing or visual impairment. 5. Diet and physical activities 6. Evidence for depression or mood disorder  Subjective:   Review of Systems:   Constitutional: Denies fever, malaise, fatigue, headache or abrupt weight changes.  HEENT:  Denies eye pain, eye redness, ear pain, ringing in the ears, wax buildup, runny nose, nasal congestion, bloody nose, or sore throat. Respiratory: Denies difficulty breathing, shortness of breath, cough or sputum production.   Cardiovascular: Denies chest pain, chest tightness, palpitations or swelling in the hands or feet.  Gastrointestinal: Pt reports constipation. Denies abdominal pain, bloating, diarrhea or blood in the stool.  GU: Denies urgency, frequency, pain with urination, burning sensation, blood in urine, odor or discharge. Musculoskeletal: Denies decrease in range of motion, difficulty with gait, muscle pain or joint pain and swelling.  Skin: Denies redness, rashes, lesions or ulcercations.  Neurological: Denies dizziness, difficulty with memory, difficulty with speech or problems with balance and coordination.  Psych: Pt reports history of depression. Denies anxiety, SI/HI.  No other specific complaints in a complete review of systems (except as listed in HPI above).  Objective:  PE:   BP 124/74 (BP Location: Right Arm, Patient Position: Sitting, Cuff Size: Large)   Pulse 73   Temp 97.9 F (36.6 C) (Oral)   Ht 5' 2"  (1.575 m)   Wt 156 lb (70.8 kg)   BMI 28.53 kg/m   Wt Readings from Last 3 Encounters:  02/02/16 159 lb (72.1 kg)  10/04/15 163 lb (73.9 kg)  07/27/15 165 lb (74.8 kg)    General: Appears her stated age, well developed, well nourished in NAD. Skin: Warm, dry and intact. HEENT: Head: normal shape and size; Eyes: sclera white, no icterus, conjunctiva pink, PERRLA and EOMs intact;  Cardiovascular: Normal rate and rhythm. S1,S2 noted.  No murmur, rubs or gallops noted. No JVD or BLE edema. No carotid bruits noted. Pulmonary/Chest: Normal effort and positive vesicular breath sounds. No respiratory distress. No wheezes, rales or ronchi noted.  Neurological: Alert and oriented.  Psychiatric: Mood and affect normal. Behavior is normal. Judgment and thought  content normal.     BMET    Component Value Date/Time   NA 139 10/04/2015 0914   K 3.9 10/04/2015 0914   CL 100 10/04/2015 0914   CO2 26 10/04/2015 0914   GLUCOSE 100 (H) 10/04/2015 0914   BUN 21 10/04/2015 0914   CREATININE 1.02 10/04/2015 0914   CALCIUM 9.6 10/04/2015 0914   GFRNONAA >60 02/14/2009 0450   GFRAA  02/14/2009 0450    >60        The eGFR has been calculated using the MDRD equation. This calculation has not been validated in all clinical situations. eGFR's persistently <60 mL/min signify possible Chronic Kidney Disease.    Lipid Panel  Component Value Date/Time   CHOL 243 (H) 10/04/2015 0914   TRIG 108.0 10/04/2015 0914   HDL 83.50 10/04/2015 0914   CHOLHDL 3 10/04/2015 0914   VLDL 21.6 10/04/2015 0914   LDLCALC 138 (H) 10/04/2015 0914    CBC    Component Value Date/Time   WBC 4.1 03/25/2015 0837   RBC 4.31 03/25/2015 0837   HGB 13.5 03/25/2015 0837   HCT 39.6 03/25/2015 0837   PLT 244.0 03/25/2015 0837   MCV 91.9 03/25/2015 0837   MCHC 34.0 03/25/2015 0837   RDW 13.2 03/25/2015 0837    Hgb A1C Lab Results  Component Value Date   HGBA1C 5.7 09/29/2013      Assessment and Plan:   Medicare Annual Wellness Visit:  Diet: She eats what she wants, some fruits and veggies, some fried foods. She drinks mostly water, occasional diet coke. Physical activity: She goes [facility] x [facility] for 1 hour Depression/mood screen: Chronic, medication needs adjustment  Hearing: Intact to whispered voice Visual acuity: Grossly normal, she wears contacts, performs annual eye exam  ADLs: Capable Fall risk: None Home safety: Good Cognitive evaluation: Intact to orientation, naming, recall and repetition EOL planning: Living will, full code/ I agree     Preventative Medicine: She declines pap smear today, All other HM UTD.   Next appointment: 1 year   Webb Silversmith, NP

## 2016-04-13 DIAGNOSIS — H5203 Hypermetropia, bilateral: Secondary | ICD-10-CM | POA: Diagnosis not present

## 2016-04-13 DIAGNOSIS — H524 Presbyopia: Secondary | ICD-10-CM | POA: Diagnosis not present

## 2016-04-13 DIAGNOSIS — H25813 Combined forms of age-related cataract, bilateral: Secondary | ICD-10-CM | POA: Diagnosis not present

## 2016-04-13 DIAGNOSIS — H52223 Regular astigmatism, bilateral: Secondary | ICD-10-CM | POA: Diagnosis not present

## 2016-04-19 DIAGNOSIS — Z0100 Encounter for examination of eyes and vision without abnormal findings: Secondary | ICD-10-CM | POA: Diagnosis not present

## 2016-06-13 DIAGNOSIS — R69 Illness, unspecified: Secondary | ICD-10-CM | POA: Diagnosis not present

## 2016-06-22 ENCOUNTER — Other Ambulatory Visit: Payer: Self-pay | Admitting: Gynecology

## 2016-06-22 ENCOUNTER — Ambulatory Visit (INDEPENDENT_AMBULATORY_CARE_PROVIDER_SITE_OTHER): Payer: Medicare HMO | Admitting: Gynecology

## 2016-06-22 ENCOUNTER — Encounter: Payer: Self-pay | Admitting: Gynecology

## 2016-06-22 VITALS — BP 128/76 | Ht 61.25 in | Wt 156.0 lb

## 2016-06-22 DIAGNOSIS — Z01411 Encounter for gynecological examination (general) (routine) with abnormal findings: Secondary | ICD-10-CM | POA: Diagnosis not present

## 2016-06-22 DIAGNOSIS — R350 Frequency of micturition: Secondary | ICD-10-CM | POA: Diagnosis not present

## 2016-06-22 DIAGNOSIS — M8580 Other specified disorders of bone density and structure, unspecified site: Secondary | ICD-10-CM

## 2016-06-22 DIAGNOSIS — Z780 Asymptomatic menopausal state: Secondary | ICD-10-CM

## 2016-06-22 DIAGNOSIS — N3001 Acute cystitis with hematuria: Secondary | ICD-10-CM

## 2016-06-22 LAB — URINALYSIS W MICROSCOPIC + REFLEX CULTURE
BILIRUBIN URINE: NEGATIVE
Casts: NONE SEEN [LPF]
Crystals: NONE SEEN [HPF]
GLUCOSE, UA: NEGATIVE
Ketones, ur: NEGATIVE
Nitrite: NEGATIVE
PH: 6 (ref 5.0–8.0)
Protein, ur: NEGATIVE
Specific Gravity, Urine: 1.01 (ref 1.001–1.035)
Yeast: NONE SEEN [HPF]

## 2016-06-22 MED ORDER — NITROFURANTOIN MONOHYD MACRO 100 MG PO CAPS: 100.0000 mg | ORAL_CAPSULE | Freq: Two times a day (BID) | ORAL | 0 refills | Status: DC

## 2016-06-22 NOTE — Patient Instructions (Signed)
Nitrofurantoin tablets or capsules What is this medicine? NITROFURANTOIN (nye troe fyoor AN toyn) is an antibiotic. It is used to treat urinary tract infections. This medicine may be used for other purposes; ask your health care provider or pharmacist if you have questions. What should I tell my health care provider before I take this medicine? They need to know if you have any of these conditions: -anemia -diabetes -glucose-6-phosphate dehydrogenase deficiency -kidney disease -liver disease -lung disease -other chronic illness -an unusual or allergic reaction to nitrofurantoin, other antibiotics, other medicines, foods, dyes or preservatives -pregnant or trying to get pregnant -breast-feeding How should I use this medicine? Take this medicine by mouth with a glass of water. Follow the directions on the prescription label. Take this medicine with food or milk. Take your doses at regular intervals. Do not take your medicine more often than directed. Do not stop taking except on your doctor's advice. Talk to your pediatrician regarding the use of this medicine in children. While this drug may be prescribed for selected conditions, precautions do apply. Overdosage: If you think you have taken too much of this medicine contact a poison control center or emergency room at once. NOTE: This medicine is only for you. Do not share this medicine with others. What if I miss a dose? If you miss a dose, take it as soon as you can. If it is almost time for your next dose, take only that dose. Do not take double or extra doses. What may interact with this medicine? -antacids containing magnesium trisilicate -probenecid -quinolone antibiotics like ciprofloxacin, lomefloxacin, norfloxacin and ofloxacin -sulfinpyrazone This list may not describe all possible interactions. Give your health care provider a list of all the medicines, herbs, non-prescription drugs, or dietary supplements you use. Also tell  them if you smoke, drink alcohol, or use illegal drugs. Some items may interact with your medicine. What should I watch for while using this medicine? Tell your doctor or health care professional if your symptoms do not improve or if you get new symptoms. Drink several glasses of water a day. If you are taking this medicine for a long time, visit your doctor for regular checks on your progress. If you are diabetic, you may get a false positive result for sugar in your urine with certain brands of urine tests. Check with your doctor. What side effects may I notice from receiving this medicine? Side effects that you should report to your doctor or health care professional as soon as possible: -allergic reactions like skin rash or hives, swelling of the face, lips, or tongue -chest pain -cough -difficulty breathing -dizziness, drowsiness -fever or infection -joint aches or pains -pale or blue-tinted skin -redness, blistering, peeling or loosening of the skin, including inside the mouth -tingling, burning, pain, or numbness in hands or feet -unusual bleeding or bruising -unusually weak or tired -yellowing of eyes or skin Side effects that usually do not require medical attention (report to your doctor or health care professional if they continue or are bothersome): -dark urine -diarrhea -headache -loss of appetite -nausea or vomiting -temporary hair loss This list may not describe all possible side effects. Call your doctor for medical advice about side effects. You may report side effects to FDA at 1-800-FDA-1088. Where should I keep my medicine? Keep out of the reach of children. Store at room temperature between 15 and 30 degrees C (59 and 86 degrees F). Protect from light. Throw away any unused medicine after the expiration date. NOTE:   This sheet is a summary. It may not cover all possible information. If you have questions about this medicine, talk to your doctor, pharmacist, or health  care provider.    2016, Elsevier/Gold Standard. (2008-03-18 15:56:47) Urinary Tract Infection Urinary tract infections (UTIs) can develop anywhere along your urinary tract. Your urinary tract is your body's drainage system for removing wastes and extra water. Your urinary tract includes two kidneys, two ureters, a bladder, and a urethra. Your kidneys are a pair of bean-shaped organs. Each kidney is about the size of your fist. They are located below your ribs, one on each side of your spine. CAUSES Infections are caused by microbes, which are microscopic organisms, including fungi, viruses, and bacteria. These organisms are so small that they can only be seen through a microscope. Bacteria are the microbes that most commonly cause UTIs. SYMPTOMS  Symptoms of UTIs may vary by age and gender of the patient and by the location of the infection. Symptoms in young women typically include a frequent and intense urge to urinate and a painful, burning feeling in the bladder or urethra during urination. Older women and men are more likely to be tired, shaky, and weak and have muscle aches and abdominal pain. A fever may mean the infection is in your kidneys. Other symptoms of a kidney infection include pain in your back or sides below the ribs, nausea, and vomiting. DIAGNOSIS To diagnose a UTI, your caregiver will ask you about your symptoms. Your caregiver will also ask you to provide a urine sample. The urine sample will be tested for bacteria and white blood cells. White blood cells are made by your body to help fight infection. TREATMENT  Typically, UTIs can be treated with medication. Because most UTIs are caused by a bacterial infection, they usually can be treated with the use of antibiotics. The choice of antibiotic and length of treatment depend on your symptoms and the type of bacteria causing your infection. HOME CARE INSTRUCTIONS  If you were prescribed antibiotics, take them exactly as your  caregiver instructs you. Finish the medication even if you feel better after you have only taken some of the medication.  Drink enough water and fluids to keep your urine clear or pale yellow.  Avoid caffeine, tea, and carbonated beverages. They tend to irritate your bladder.  Empty your bladder often. Avoid holding urine for long periods of time.  Empty your bladder before and after sexual intercourse.  After a bowel movement, women should cleanse from front to back. Use each tissue only once. SEEK MEDICAL CARE IF:   You have back pain.  You develop a fever.  Your symptoms do not begin to resolve within 3 days. SEEK IMMEDIATE MEDICAL CARE IF:   You have severe back pain or lower abdominal pain.  You develop chills.  You have nausea or vomiting.  You have continued burning or discomfort with urination. MAKE SURE YOU:   Understand these instructions.  Will watch your condition.  Will get help right away if you are not doing well or get worse.   This information is not intended to replace advice given to you by your health care provider. Make sure you discuss any questions you have with your health care provider.   Document Released: 06/07/2005 Document Revised: 05/19/2015 Document Reviewed: 10/06/2011 Elsevier Interactive Patient Education 2016 Elsevier Inc.  

## 2016-06-22 NOTE — Progress Notes (Signed)
Monica Fuller [birth year] [id number]   History:    75 y.o.  for annual gyn exam who has not been seen in the office since 2014. Patient is complaining of recently urinating freely during the day and having to get up at night several times a urinate. She denies any true dysuria. No fever, chills, nausea, vomiting. She did develop flulike syndromes shortly after she received the flu vaccine recently. Review of her record indicated she had been on Fosamax for about 15 years and had discontinued approximately 8 years ago. She is taking her calcium and vitamin D. I reviewed her bone density study from 2015 which demonstrated her lowest T score was -2.4 at the left femoral neck and her Frax analysis indicated that she had an increase fracture is a 4.6% and a general osteoporotic fracture risk of 15%. Her PCP has been doing her blood work and she has not had a vitamin D level recently.Patient had a colonoscopy in 2013 and benign polyp was removed. She is in a 5 year recall. Patient denies any vaginal bleeding and on no hormone replacement therapy. Patient with no previous history of any abnormal Pap smears. All her vaccines are up-to-date.  Past medical history,surgical history, family history and social history were all reviewed and documented in the EPIC chart.  Gynecologic History No LMP recorded. Patient is postmenopausal. Contraception: post menopausal status Last Pap: 2012. Results were: normal Last mammogram: 2017. Results were: normal  Obstetric History OB History  Gravida Para Term Preterm AB Living  0            SAB TAB Ectopic Multiple Live Births                    ROS: A ROS was performed and pertinent positives and negatives are included in the history.  GENERAL: No fevers or chills. HEENT: No change in vision, no earache, sore throat or sinus congestion. NECK: No pain or stiffness. CARDIOVASCULAR: No chest pain or pressure. No palpitations. PULMONARY: No shortness of breath, cough  or wheeze. GASTROINTESTINAL: No abdominal pain, nausea, vomiting or diarrhea, melena or bright red blood per rectum. GENITOURINARY: No urinary frequency, urgency, hesitancy or dysuria. MUSCULOSKELETAL: No joint or muscle pain, no back pain, no recent trauma. DERMATOLOGIC: No rash, no itching, no lesions. ENDOCRINE: No polyuria, polydipsia, no heat or cold intolerance. No recent change in weight. HEMATOLOGICAL: No anemia or easy bruising or bleeding. NEUROLOGIC: No headache, seizures, numbness, tingling or weakness. PSYCHIATRIC: No depression, no loss of interest in normal activity or change in sleep pattern.     Exam: chaperone present  BP 128/76   Ht 5' 1.25" (1.556 m)   Wt 156 lb (70.8 kg)   BMI 29.24 kg/m   Body mass index is 29.24 kg/m.  General appearance : Well developed well nourished female. No acute distress HEENT: Eyes: no retinal hemorrhage or exudates,  Neck supple, trachea midline, no carotid bruits, no thyroidmegaly Lungs: Clear to auscultation, no rhonchi or wheezes, or rib retractions  Heart: Regular rate and rhythm, no murmurs or gallops Breast:Examined in sitting and supine position were symmetrical in appearance, no palpable masses or tenderness,  no skin retraction, no nipple inversion, no nipple discharge, no skin discoloration, no axillary or supraclavicular lymphadenopathy Abdomen: no palpable masses or tenderness, no rebound or guarding Extremities: no edema or skin discoloration or tenderness  Pelvic:  Bartholin, Urethra, Skene Glands: Within normal limits  Vagina: No gross lesions or discharge, atrophic changes  Cervix: No gross lesions or discharge  Uterus  anteverted, normal size, shape and consistency, non-tender and mobile  Adnexa  Without masses or tenderness  Anus and perineum  normal   Rectovaginal  normal sphincter tone without palpated masses or tenderness             Hemoccult PCP provides   Urinalysis: Moderate bacteria, 40-60 WBC, 3-10  RBC culture pending  Assessment/Plan:  75 y.o. female for annual exam with history of osteopenia high-risk for fracture due for her next bone density study this December. We'll compare F her risk is still elevated she may need to return back to anti-resorptive agent such as Fosamax. She was encouraged to continue her calcium vitamin D and weightbearing exercises. For her urinary tract infection she'll be treated with Macrobid one by mouth twice a day for 7 days. Pap smear no longer indicated according to the new guidelines. Her PCP has been doing her blood work.   Terrance Mass MD, 11:37 AM 06/22/2016

## 2016-06-23 LAB — VITAMIN D 25 HYDROXY (VIT D DEFICIENCY, FRACTURES): Vit D, 25-Hydroxy: 46 ng/mL (ref 30–100)

## 2016-06-24 LAB — URINE CULTURE

## 2016-07-04 ENCOUNTER — Ambulatory Visit (INDEPENDENT_AMBULATORY_CARE_PROVIDER_SITE_OTHER): Payer: Medicare HMO | Admitting: Internal Medicine

## 2016-07-04 ENCOUNTER — Encounter: Payer: Self-pay | Admitting: Internal Medicine

## 2016-07-04 VITALS — BP 122/80 | HR 79 | Temp 97.9°F | Wt 155.8 lb

## 2016-07-04 DIAGNOSIS — R3915 Urgency of urination: Secondary | ICD-10-CM | POA: Diagnosis not present

## 2016-07-04 DIAGNOSIS — N3941 Urge incontinence: Secondary | ICD-10-CM | POA: Diagnosis not present

## 2016-07-04 DIAGNOSIS — N390 Urinary tract infection, site not specified: Secondary | ICD-10-CM

## 2016-07-04 LAB — POC URINALSYSI DIPSTICK (AUTOMATED)
Bilirubin, UA: NEGATIVE
GLUCOSE UA: NEGATIVE
Ketones, UA: NEGATIVE
NITRITE UA: POSITIVE
PROTEIN UA: NEGATIVE
RBC UA: NEGATIVE
SPEC GRAV UA: 1.025
UROBILINOGEN UA: NEGATIVE
pH, UA: 6

## 2016-07-04 MED ORDER — CIPROFLOXACIN HCL 250 MG PO TABS: 250.0000 mg | ORAL_TABLET | Freq: Two times a day (BID) | ORAL | 0 refills | Status: DC

## 2016-07-04 NOTE — Progress Notes (Signed)
HPI  Pt presents to the clinic today with c/o urinary urgency with some urge incontinence. This started about 2 weeks ago. She was seen 10/12 for the same, diagnosed with a UTI and started on Macrobid. Her urine culture grew E Coli which was sensitive to Macrobid. She reports she did feel a little better while on the antibiotics, but her symptoms returned shortly after stopping them. In addition to the urinary symptoms, she has developed low back pain. She denies fever, chills or body aches. She has not tried anything additional OTC.   Review of Systems  Past Medical History:  Diagnosis Date  . Allergy   . Arthritis   . Depression   . Hypertension     Family History  Problem Relation Age of Onset  . Hypertension Sister   . Hypertension Brother   . Diabetes Brother   . Cancer Mother     CERVICAL  . Heart disease Mother   . Stroke Mother   . Hypertension Mother   . Heart disease Father   . Hypertension Father   . Diabetes Father     Social History   Social History  . Marital status: Widowed    Spouse name: N/A  . Number of children: N/A  . Years of education: N/A   Occupational History  . Not on file.   Social History Main Topics  . Smoking status: Never Smoker  . Smokeless tobacco: Never Used  . Alcohol use No  . Drug use: Unknown  . Sexual activity: No   Other Topics Concern  . Not on file   Social History Narrative  . No narrative on file    Allergies  Allergen Reactions  . Codeine Anxiety    Constitutional: Denies fever, malaise, fatigue, headache or abrupt weight changes.   GU: Pt reports urgency and incontinence. Denies frequency, dysuria, burning sensation, blood in urine, odor or discharge. MSK: Pt reports low back pain. Denies decrease in range of motion, muscle pain, joint pain or swelling.    No other specific complaints in a complete review of systems (except as listed in HPI above).    Objective:   Physical Exam  BP 122/80   Pulse 79    Temp 97.9 F (36.6 C) (Oral)   Wt 155 lb 12 oz (70.6 kg)   SpO2 97%   BMI 29.19 kg/m  Wt Readings from Last 3 Encounters:  07/04/16 155 lb 12 oz (70.6 kg)  06/22/16 156 lb (70.8 kg)  04/10/16 156 lb (70.8 kg)    General: Appears her stated age, well developed, well nourished in NAD. Cardiovascular: Normal rate and rhythm. S1,S2 noted.   Pulmonary/Chest: Normal effort and positive vesicular breath sounds. No respiratory distress. No wheezes, rales or ronchi noted.  Abdomen: Soft. Normal bowel sounds. No distention or masses noted.  Tender to palpation over the bladder area. Bilateral CVA tenderness, L>R. MSK: No bony tenderness noted over the spine. No pain with palpation of the bilateral paralumbar muscles.      Assessment & Plan:   Urgency and urge incontinence, secondary to UTI:   Urinalysis: 3 + leuks, pos nitrites Will send urine culture eRx sent if for Cipro 250 mg BID x 7 days OK to take AZO OTC Drink plenty of fluids  RTC as needed or if symptoms persist. Webb Silversmith, NP

## 2016-07-04 NOTE — Patient Instructions (Signed)

## 2016-07-04 NOTE — Addendum Note (Signed)
Addended by: Lurlean Nanny on: 07/04/2016 04:56 PM   Modules accepted: Orders

## 2016-07-06 LAB — URINE CULTURE

## 2016-07-11 ENCOUNTER — Telehealth: Payer: Self-pay | Admitting: *Deleted

## 2016-07-11 ENCOUNTER — Other Ambulatory Visit: Payer: Self-pay | Admitting: Internal Medicine

## 2016-07-11 DIAGNOSIS — R300 Dysuria: Secondary | ICD-10-CM

## 2016-07-11 NOTE — Telephone Encounter (Signed)
Patient called stating that she was in recently with a bladder infection. Patient stated that she was told to call back if she did not think that she was over it. Patient stated that she is still having some back pain, urine is cloudy and burning a little with urination. Patient stated that she may need more antibiotic to clear this up. Pharmacy- [facility]

## 2016-07-11 NOTE — Addendum Note (Signed)
Addended by: Lurlean Nanny on: 07/11/2016 01:50 PM   Modules accepted: Orders

## 2016-07-11 NOTE — Telephone Encounter (Signed)
Can she drop off a urine sample?

## 2016-07-11 NOTE — Telephone Encounter (Signed)
Pt states she can come in the morning to provide a sample.Marland KitchenMarland Kitchen

## 2016-07-11 NOTE — Telephone Encounter (Signed)
Electronic refill request. Last office visit:   07/04/16 Last Filled:    14 tablet 0 07/04/2016   Please advise.

## 2016-07-11 NOTE — Telephone Encounter (Signed)
Ok, I will check it at that time and let her know if additional abx are needed

## 2016-07-12 ENCOUNTER — Other Ambulatory Visit (INDEPENDENT_AMBULATORY_CARE_PROVIDER_SITE_OTHER): Payer: Medicare HMO

## 2016-07-12 ENCOUNTER — Telehealth: Payer: Self-pay | Admitting: Internal Medicine

## 2016-07-12 DIAGNOSIS — R300 Dysuria: Secondary | ICD-10-CM | POA: Diagnosis not present

## 2016-07-12 DIAGNOSIS — N390 Urinary tract infection, site not specified: Secondary | ICD-10-CM

## 2016-07-12 LAB — POC URINALSYSI DIPSTICK (AUTOMATED)
Bilirubin, UA: NEGATIVE
Glucose, UA: NEGATIVE
KETONES UA: NEGATIVE
Leukocytes, UA: NEGATIVE
Nitrite, UA: NEGATIVE
PH UA: 6
PROTEIN UA: NEGATIVE
RBC UA: NEGATIVE
SPEC GRAV UA: 1.02
UROBILINOGEN UA: NEGATIVE

## 2016-07-12 NOTE — Telephone Encounter (Signed)
No abx needed Increase water intake

## 2016-07-12 NOTE — Telephone Encounter (Signed)
Pt did come in to give a UA sample---it was negative and has been resulted in pt's chart--please advise

## 2016-07-12 NOTE — Telephone Encounter (Signed)
Pt called to check on her results of urine sample

## 2016-07-12 NOTE — Addendum Note (Signed)
Addended by: Lurlean Nanny on: 07/12/2016 11:25 AM   Modules accepted: Orders

## 2016-07-12 NOTE — Telephone Encounter (Signed)
Pt is aware as instructed 

## 2016-08-05 DIAGNOSIS — Z0000 Encounter for general adult medical examination without abnormal findings: Secondary | ICD-10-CM | POA: Diagnosis not present

## 2016-08-05 DIAGNOSIS — Z6828 Body mass index (BMI) 28.0-28.9, adult: Secondary | ICD-10-CM | POA: Diagnosis not present

## 2016-08-05 DIAGNOSIS — I10 Essential (primary) hypertension: Secondary | ICD-10-CM | POA: Diagnosis not present

## 2016-08-05 DIAGNOSIS — R69 Illness, unspecified: Secondary | ICD-10-CM | POA: Diagnosis not present

## 2016-10-12 DIAGNOSIS — R69 Illness, unspecified: Secondary | ICD-10-CM | POA: Diagnosis not present

## 2016-10-26 ENCOUNTER — Other Ambulatory Visit: Payer: Self-pay | Admitting: Internal Medicine

## 2016-10-26 DIAGNOSIS — Z1231 Encounter for screening mammogram for malignant neoplasm of breast: Secondary | ICD-10-CM

## 2016-11-06 DIAGNOSIS — L821 Other seborrheic keratosis: Secondary | ICD-10-CM | POA: Diagnosis not present

## 2016-11-06 DIAGNOSIS — L570 Actinic keratosis: Secondary | ICD-10-CM | POA: Diagnosis not present

## 2016-11-06 DIAGNOSIS — B079 Viral wart, unspecified: Secondary | ICD-10-CM | POA: Diagnosis not present

## 2016-11-06 DIAGNOSIS — D225 Melanocytic nevi of trunk: Secondary | ICD-10-CM | POA: Diagnosis not present

## 2016-11-06 DIAGNOSIS — D1801 Hemangioma of skin and subcutaneous tissue: Secondary | ICD-10-CM | POA: Diagnosis not present

## 2016-11-06 DIAGNOSIS — L814 Other melanin hyperpigmentation: Secondary | ICD-10-CM | POA: Diagnosis not present

## 2016-11-22 ENCOUNTER — Ambulatory Visit
Admission: RE | Admit: 2016-11-22 | Discharge: 2016-11-22 | Disposition: A | Discharge status: Home / Self Care | Payer: Medicare HMO | Source: Ambulatory Visit | Attending: Internal Medicine | Admitting: Internal Medicine

## 2016-11-22 DIAGNOSIS — Z1231 Encounter for screening mammogram for malignant neoplasm of breast: Secondary | ICD-10-CM

## 2017-01-08 ENCOUNTER — Other Ambulatory Visit: Payer: Self-pay | Admitting: Internal Medicine

## 2017-01-08 DIAGNOSIS — I10 Essential (primary) hypertension: Secondary | ICD-10-CM

## 2017-01-24 ENCOUNTER — Encounter: Payer: Self-pay | Admitting: Gynecology

## 2017-03-17 DIAGNOSIS — R233 Spontaneous ecchymoses: Secondary | ICD-10-CM | POA: Diagnosis not present

## 2017-03-17 DIAGNOSIS — Z0000 Encounter for general adult medical examination without abnormal findings: Secondary | ICD-10-CM | POA: Diagnosis not present

## 2017-03-17 DIAGNOSIS — I10 Essential (primary) hypertension: Secondary | ICD-10-CM | POA: Diagnosis not present

## 2017-03-17 DIAGNOSIS — Z6829 Body mass index (BMI) 29.0-29.9, adult: Secondary | ICD-10-CM | POA: Diagnosis not present

## 2017-03-17 DIAGNOSIS — R69 Illness, unspecified: Secondary | ICD-10-CM | POA: Diagnosis not present

## 2017-03-17 DIAGNOSIS — E663 Overweight: Secondary | ICD-10-CM | POA: Diagnosis not present

## 2017-03-17 DIAGNOSIS — J301 Allergic rhinitis due to pollen: Secondary | ICD-10-CM | POA: Diagnosis not present

## 2017-03-17 DIAGNOSIS — M19042 Primary osteoarthritis, left hand: Secondary | ICD-10-CM | POA: Diagnosis not present

## 2017-03-17 DIAGNOSIS — K649 Unspecified hemorrhoids: Secondary | ICD-10-CM | POA: Diagnosis not present

## 2017-04-12 ENCOUNTER — Encounter: Payer: Self-pay | Admitting: Internal Medicine

## 2017-04-12 ENCOUNTER — Ambulatory Visit (INDEPENDENT_AMBULATORY_CARE_PROVIDER_SITE_OTHER): Payer: Medicare HMO | Admitting: Internal Medicine

## 2017-04-12 VITALS — BP 126/84 | HR 65 | Temp 97.9°F | Ht 61.5 in | Wt 157.5 lb

## 2017-04-12 DIAGNOSIS — I10 Essential (primary) hypertension: Secondary | ICD-10-CM

## 2017-04-12 DIAGNOSIS — F419 Anxiety disorder, unspecified: Secondary | ICD-10-CM | POA: Diagnosis not present

## 2017-04-12 DIAGNOSIS — E7800 Pure hypercholesterolemia, unspecified: Secondary | ICD-10-CM | POA: Diagnosis not present

## 2017-04-12 DIAGNOSIS — F32A Depression, unspecified: Secondary | ICD-10-CM

## 2017-04-12 DIAGNOSIS — Z0000 Encounter for general adult medical examination without abnormal findings: Secondary | ICD-10-CM | POA: Diagnosis not present

## 2017-04-12 DIAGNOSIS — F329 Major depressive disorder, single episode, unspecified: Secondary | ICD-10-CM

## 2017-04-12 DIAGNOSIS — R300 Dysuria: Secondary | ICD-10-CM

## 2017-04-12 DIAGNOSIS — R69 Illness, unspecified: Secondary | ICD-10-CM | POA: Diagnosis not present

## 2017-04-12 LAB — POC URINALSYSI DIPSTICK (AUTOMATED)
BILIRUBIN UA: NEGATIVE
GLUCOSE UA: NEGATIVE
Ketones, UA: NEGATIVE
Leukocytes, UA: NEGATIVE
Nitrite, UA: NEGATIVE
Protein, UA: NEGATIVE
RBC UA: NEGATIVE
SPEC GRAV UA: 1.015 (ref 1.010–1.025)
Urobilinogen, UA: 0.2 E.U./dL
pH, UA: 7.5 (ref 5.0–8.0)

## 2017-04-12 LAB — CBC
HCT: 39.4 % (ref 36.0–46.0)
Hemoglobin: 13.3 g/dL (ref 12.0–15.0)
MCHC: 33.6 g/dL (ref 30.0–36.0)
MCV: 93.6 fl (ref 78.0–100.0)
Platelets: 221 10*3/uL (ref 150.0–400.0)
RBC: 4.21 Mil/uL (ref 3.87–5.11)
RDW: 13.3 % (ref 11.5–15.5)
WBC: 3.7 10*3/uL — ABNORMAL LOW (ref 4.0–10.5)

## 2017-04-12 LAB — LIPID PANEL
CHOL/HDL RATIO: 3
Cholesterol: 230 mg/dL — ABNORMAL HIGH (ref 0–200)
HDL: 73.3 mg/dL (ref >39.00)
LDL CALC: 132 mg/dL — AB (ref 0–99)
NONHDL: 157.06
Triglycerides: 126 mg/dL (ref 0.0–149.0)
VLDL: 25.2 mg/dL (ref 0.0–40.0)

## 2017-04-12 LAB — COMPREHENSIVE METABOLIC PANEL
ALT: 16 U/L (ref 0–35)
AST: 20 U/L (ref 0–37)
Albumin: 4.6 g/dL (ref 3.5–5.2)
Alkaline Phosphatase: 46 U/L (ref 39–117)
BUN: 20 mg/dL (ref 6–23)
CHLORIDE: 101 meq/L (ref 96–112)
CO2: 31 meq/L (ref 19–32)
Calcium: 9.8 mg/dL (ref 8.4–10.5)
Creatinine, Ser: 1.04 mg/dL (ref 0.40–1.20)
GFR: 54.4 mL/min — AB (ref >60.00)
GLUCOSE: 94 mg/dL (ref 70–99)
POTASSIUM: 4.2 meq/L (ref 3.5–5.1)
Sodium: 139 mEq/L (ref 135–145)
Total Bilirubin: 0.6 mg/dL (ref 0.2–1.2)
Total Protein: 7.3 g/dL (ref 6.0–8.3)

## 2017-04-12 LAB — VITAMIN D 25 HYDROXY (VIT D DEFICIENCY, FRACTURES): VITD: 78.78 ng/mL (ref 30.00–100.00)

## 2017-04-12 MED ORDER — TRIAMTERENE-HCTZ 75-50 MG PO TABS: 1.0000 | ORAL_TABLET | Freq: Every day | ORAL | 3 refills | Status: DC

## 2017-04-12 MED ORDER — SERTRALINE HCL 100 MG PO TABS: 100.0000 mg | ORAL_TABLET | Freq: Every day | ORAL | 3 refills | Status: DC

## 2017-04-12 MED ORDER — CLONAZEPAM 0.5 MG PO TABS: 0.5000 mg | ORAL_TABLET | Freq: Every day | ORAL | 0 refills | Status: DC

## 2017-04-12 NOTE — Progress Notes (Signed)
HPI:  Pt presents to the clinic today for her Medicare Wellness Exam. She is also due to follow up chronic conditions.  HLD: Her last LDL was 146, 03/2016. She is taking Fish Oil and Red Yeast Rice. She tries to consume a low fat diet.  HTN: Her BP today is 126/84. She is taking Triamterene-HCT as prescribed. She is requesting a refill of this today. ECG from 03/2015 reviewed.  Anxiety and Depression: Chronic but stable on Zoloft. She uses the Clonazepam for sleep when she is "unable to shut her mind off". She would like a refill of these medications today. She denies SI/HI.  She reports intermittent dysuria. This has been going on for about 2 months. She reports it burns after urinating. She denies urgency, frequency or blood in her urine. She denies vaginal complaints. She has not tried anything OTC for this.  Past Medical History:  Diagnosis Date  . Allergy   . Arthritis   . Depression   . Hypertension     Current Outpatient Prescriptions  Medication Sig Dispense Refill  . aspirin 81 MG tablet Take 81 mg by mouth daily.      . Calcium Carbonate (CALCIUM 600 PO) Take 2 capsules by mouth daily.    . cholecalciferol (VITAMIN D) 1000 UNITS tablet Take 1,000 Units by mouth daily.    . ciprofloxacin (CIPRO) 250 MG tablet Take 1 tablet (250 mg total) by mouth 2 (two) times daily. 14 tablet 0  . clonazePAM (KLONOPIN) 0.5 MG tablet Take 1 tablet (0.5 mg total) by mouth at bedtime. 30 tablet 0  . fish oil-omega-3 fatty acids 1000 MG capsule Take 1,000 mg by mouth daily.      . Magnesium 500 MG CAPS Take 1 capsule by mouth daily.    . Multiple Vitamin (MULTIVITAMIN) capsule Take 1 capsule by mouth daily.      . Red Yeast Rice Extract (RED YEAST RICE PO) Take 1 capsule by mouth daily.    . sertraline (ZOLOFT) 100 MG tablet Take 1.5 tablets (150 mg total) by mouth daily. 75 tablet 5  . triamterene-hydrochlorothiazide (MAXZIDE) 75-50 MG tablet TAKE 1 TABLET BY MOUTH DAILY 90 tablet 0  .  trimethoprim-polymyxin b (POLYTRIM) ophthalmic solution Place 1 drop into the right eye 4 (four) times daily. 10 mL 0  . vitamin B-12 (CYANOCOBALAMIN) 1000 MCG tablet Take 1,000 mcg by mouth daily.     No current facility-administered medications for this visit.     Allergies  Allergen Reactions  . Codeine Anxiety    Family History  Problem Relation Age of Onset  . Hypertension Sister   . Hypertension Brother   . Diabetes Brother   . Cancer Mother        CERVICAL  . Heart disease Mother   . Stroke Mother   . Hypertension Mother   . Heart disease Father   . Hypertension Father   . Diabetes Father   . Breast cancer Other     Social History   Social History  . Marital status: Widowed    Spouse name: N/A  . Number of children: N/A  . Years of education: N/A   Occupational History  . Not on file.   Social History Main Topics  . Smoking status: Never Smoker  . Smokeless tobacco: Never Used  . Alcohol use No  . Drug use: Unknown  . Sexual activity: No   Other Topics Concern  . Not on file   Social History Narrative  .  No narrative on file    Hospitiliaztions: None  Health Maintenance:    Flu: 06/2016  Tetanus: 04/2011  Pneumovax: 04/2008  Prevnar: 03/2015  Zostavax: 04/2011  Mammogram: 11/2016  Pap Smear: 03/2011  Bone Density: 08/2014  Colon Screening: 03/2012  Eye Doctor: annually, 05/2016 at [place] Exam: biannually   Providers:   PCP: Webb Silversmith, NP-C  Gynecology: Dr. Toney Rakes   I have personally reviewed and have noted:  1. The patient's medical and social history 2. Their use of alcohol, tobacco or illicit drugs 3. Their current medications and supplements 4. The patient's functional ability including ADL's, fall risks, home safety risks and hearing or visual impairment. 5. Diet and physical activities 6. Evidence for depression or mood disorder  Subjective:   Review of Systems:   Constitutional: Denies fever, malaise,  fatigue, headache or abrupt weight changes.  HEENT: Denies eye pain, eye redness, ear pain, ringing in the ears, wax buildup, runny nose, nasal congestion, bloody nose, or sore throat. Respiratory: Denies difficulty breathing, shortness of breath, cough or sputum production.   Cardiovascular: Denies chest pain, chest tightness, palpitations or swelling in the hands or feet.  Gastrointestinal: Pt reports loose stools. Denies abdominal pain, bloating, constipation, diarrhea or blood in the stool.  GU: Pt reports dysuria. Denies urgency, frequency, pain with urination, blood in urine, odor or discharge. Musculoskeletal: Denies decrease in range of motion, difficulty with gait, muscle pain or joint pain and swelling.  Skin: Denies redness, rashes, lesions or ulcercations.  Neurological: Denies dizziness, difficulty with memory, difficulty with speech or problems with balance and coordination.  Psych: Pt has history of anxiety and depression. Denies SI/HI.  No other specific complaints in a complete review of systems (except as listed in HPI above).  Objective:  PE:   BP 126/84   Pulse 65   Temp 97.9 F (36.6 C) (Oral)   Ht 5' 1.5" (1.562 m)   Wt 157 lb 8 oz (71.4 kg)   SpO2 97%   BMI 29.28 kg/m   Wt Readings from Last 3 Encounters:  07/04/16 155 lb 12 oz (70.6 kg)  06/22/16 156 lb (70.8 kg)  04/10/16 156 lb (70.8 kg)    General: Appears her stated age, well developed, well nourished in NAD. Skin: Warm, dry and intact.  HEENT: Head: normal shape and size; Eyes: sclera white, no icterus, conjunctiva pink, PERRLA and EOMs intact; Ears: Tm's gray and intact, normal light reflex; Throat/Mouth: Teeth present, mucosa pink and moist, no exudate, lesions or ulcerations noted. .  Cardiovascular: Normal rate and rhythm. S1,S2 noted.  No murmur, rubs or gallops noted. No JVD or BLE edema. No carotid bruits noted. Pulmonary/Chest: Normal effort and positive vesicular breath sounds. No respiratory  distress. No wheezes, rales or ronchi noted.  Abdomen: Soft and nontender. Normal bowel sounds. No distention or masses noted. Musculoskeletal:  Strength 5/5 BUE/BLE. No difficulty with gait. Neurological: Alert and oriented.  Psychiatric: Mood and affect normal. Behavior is normal. Judgment and thought content normal.     BMET    Component Value Date/Time   NA 139 04/10/2016 0843   K 3.8 04/10/2016 0843   CL 100 04/10/2016 0843   CO2 30 04/10/2016 0843   GLUCOSE 99 04/10/2016 0843   BUN 24 (H) 04/10/2016 0843   CREATININE 0.96 04/10/2016 0843   CALCIUM 9.9 04/10/2016 0843   GFRNONAA >60 02/14/2009 0450   GFRAA  02/14/2009 0450    >60  The eGFR has been calculated using the MDRD equation. This calculation has not been validated in all clinical situations. eGFR's persistently <60 mL/min signify possible Chronic Kidney Disease.    Lipid Panel     Component Value Date/Time   CHOL 234 (H) 04/10/2016 0843   TRIG 108.0 04/10/2016 0843   HDL 66.70 04/10/2016 0843   CHOLHDL 4 04/10/2016 0843   VLDL 21.6 04/10/2016 0843   LDLCALC 146 (H) 04/10/2016 0843    CBC    Component Value Date/Time   WBC 5.3 04/10/2016 0843   RBC 4.37 04/10/2016 0843   HGB 13.2 04/10/2016 0843   HCT 39.1 04/10/2016 0843   PLT 256.0 04/10/2016 0843   MCV 89.5 04/10/2016 0843   MCHC 33.7 04/10/2016 0843   RDW 14.3 04/10/2016 0843    Hgb A1C Lab Results  Component Value Date   HGBA1C 5.7 09/29/2013      Assessment and Plan:   Medicare Annual Wellness Visit:  Diet: She does eat meat. She consumes fruits and veggies daily. She tries to avoid fried foods. She drinks mostly coffee, water, occasional soda. Physical activity: Sedentary Depression/mood screen: Chronic anxiety, mild depression, managed on meds. Hearing: Intact to whispered voice Visual acuity: Grossly normal, performs annual eye exam  ADLs: Capable Fall risk: None Home safety: Good Cognitive evaluation: Intact to  orientation, naming, recall and repetition EOL planning: Adv directives, full code/ I agree  Preventative Medicine: Encouraged her to get a flu shot in the fall. Tetanus, pneumovax, prevnar and zostovax UTD. Pap smear, bone density and colon screening UTD. She no longer wants to screen for cervical cancer. Encouraged her to consume a balanced diet and exercise regimen. Advised her to continue to see her eye doctor and dentist annually. Will check CBC< CMET, Lipid and Vit D today.  Dysuria:  Urinalysis: trace leuks Likely related to postmenopausal atrophy She declines RX for Estrace/Premarin cream  Next appointment: 1 year, Medicare Wellness Exam   Webb Silversmith, NP

## 2017-04-12 NOTE — Assessment & Plan Note (Addendum)
Controlled on Triamterene HCT Medications refilled per request CMET today Will monitor

## 2017-04-12 NOTE — Assessment & Plan Note (Signed)
Stable on Zoloft and Clonazepam Support offered today Medications refilled per request

## 2017-04-12 NOTE — Assessment & Plan Note (Signed)
CMET and Lipid profile today Encouraged her to consume a low fat diet Continue Red Yeast Rice and Fish Oil 

## 2017-04-12 NOTE — Patient Instructions (Signed)
Health Maintenance for Postmenopausal Women Menopause is a normal process in which your reproductive ability comes to an end. This process happens gradually over a span of months to years, usually between the ages of 22 and 9. Menopause is complete when you have missed 12 consecutive menstrual periods. It is important to talk with your health care provider about some of the most common conditions that affect postmenopausal women, such as heart disease, cancer, and bone loss (osteoporosis). Adopting a healthy lifestyle and getting preventive care can help to promote your health and wellness. Those actions can also lower your chances of developing some of these common conditions. What should I know about menopause? During menopause, you may experience a number of symptoms, such as:  Moderate-to-severe hot flashes.  Night sweats.  Decrease in sex drive.  Mood swings.  Headaches.  Tiredness.  Irritability.  Memory problems.  Insomnia.  Choosing to treat or not to treat menopausal changes is an individual decision that you make with your health care provider. What should I know about hormone replacement therapy and supplements? Hormone therapy products are effective for treating symptoms that are associated with menopause, such as hot flashes and night sweats. Hormone replacement carries certain risks, especially as you become older. If you are thinking about using estrogen or estrogen with progestin treatments, discuss the benefits and risks with your health care provider. What should I know about heart disease and stroke? Heart disease, heart attack, and stroke become more likely as you age. This may be due, in part, to the hormonal changes that your body experiences during menopause. These can affect how your body processes dietary fats, triglycerides, and cholesterol. Heart attack and stroke are both medical emergencies. There are many things that you can do to help prevent heart disease  and stroke:  Have your blood pressure checked at least every 1-2 years. High blood pressure causes heart disease and increases the risk of stroke.  If you are 53-22 years old, ask your health care provider if you should take aspirin to prevent a heart attack or a stroke.  Do not use any tobacco products, including cigarettes, chewing tobacco, or electronic cigarettes. If you need help quitting, ask your health care provider.  It is important to eat a healthy diet and maintain a healthy weight. ? Be sure to include plenty of vegetables, fruits, low-fat dairy products, and lean protein. ? Avoid eating foods that are high in solid fats, added sugars, or salt (sodium).  Get regular exercise. This is one of the most important things that you can do for your health. ? Try to exercise for at least 150 minutes each week. The type of exercise that you do should increase your heart rate and make you sweat. This is known as moderate-intensity exercise. ? Try to do strengthening exercises at least twice each week. Do these in addition to the moderate-intensity exercise.  Know your numbers.Ask your health care provider to check your cholesterol and your blood glucose. Continue to have your blood tested as directed by your health care provider.  What should I know about cancer screening? There are several types of cancer. Take the following steps to reduce your risk and to catch any cancer development as early as possible. Breast Cancer  Practice breast self-awareness. ? This means understanding how your breasts normally appear and feel. ? It also means doing regular breast self-exams. Let your health care provider know about any changes, no matter how small.  If you are 40  or older, have a clinician do a breast exam (clinical breast exam or CBE) every year. Depending on your age, family history, and medical history, it may be recommended that you also have a yearly breast X-ray (mammogram).  If you  have a family history of breast cancer, talk with your health care provider about genetic screening.  If you are at high risk for breast cancer, talk with your health care provider about having an MRI and a mammogram every year.  Breast cancer (BRCA) gene test is recommended for women who have family members with BRCA-related cancers. Results of the assessment will determine the need for genetic counseling and BRCA1 and for BRCA2 testing. BRCA-related cancers include these types: ? Breast. This occurs in males or females. ? Ovarian. ? Tubal. This may also be called fallopian tube cancer. ? Cancer of the abdominal or pelvic lining (peritoneal cancer). ? Prostate. ? Pancreatic.  Cervical, Uterine, and Ovarian Cancer Your health care provider may recommend that you be screened regularly for cancer of the pelvic organs. These include your ovaries, uterus, and vagina. This screening involves a pelvic exam, which includes checking for microscopic changes to the surface of your cervix (Pap test).  For women ages 21-65, health care providers may recommend a pelvic exam and a Pap test every three years. For women ages 79-65, they may recommend the Pap test and pelvic exam, combined with testing for human papilloma virus (HPV), every five years. Some types of HPV increase your risk of cervical cancer. Testing for HPV may also be done on women of any age who have unclear Pap test results.  Other health care providers may not recommend any screening for nonpregnant women who are considered low risk for pelvic cancer and have no symptoms. Ask your health care provider if a screening pelvic exam is right for you.  If you have had past treatment for cervical cancer or a condition that could lead to cancer, you need Pap tests and screening for cancer for at least 20 years after your treatment. If Pap tests have been discontinued for you, your risk factors (such as having a new sexual partner) need to be  reassessed to determine if you should start having screenings again. Some women have medical problems that increase the chance of getting cervical cancer. In these cases, your health care provider may recommend that you have screening and Pap tests more often.  If you have a family history of uterine cancer or ovarian cancer, talk with your health care provider about genetic screening.  If you have vaginal bleeding after reaching menopause, tell your health care provider.  There are currently no reliable tests available to screen for ovarian cancer.  Lung Cancer Lung cancer screening is recommended for adults 69-62 years old who are at high risk for lung cancer because of a history of smoking. A yearly low-dose CT scan of the lungs is recommended if you:  Currently smoke.  Have a history of at least 30 pack-years of smoking and you currently smoke or have quit within the past 15 years. A pack-year is smoking an average of one pack of cigarettes per day for one year.  Yearly screening should:  Continue until it has been 15 years since you quit.  Stop if you develop a health problem that would prevent you from having lung cancer treatment.  Colorectal Cancer  This type of cancer can be detected and can often be prevented.  Routine colorectal cancer screening usually begins at  age 42 and continues through age 45.  If you have risk factors for colon cancer, your health care provider may recommend that you be screened at an earlier age.  If you have a family history of colorectal cancer, talk with your health care provider about genetic screening.  Your health care provider may also recommend using home test kits to check for hidden blood in your stool.  A small camera at the end of a tube can be used to examine your colon directly (sigmoidoscopy or colonoscopy). This is done to check for the earliest forms of colorectal cancer.  Direct examination of the colon should be repeated every  5-10 years until age 71. However, if early forms of precancerous polyps or small growths are found or if you have a family history or genetic risk for colorectal cancer, you may need to be screened more often.  Skin Cancer  Check your skin from head to toe regularly.  Monitor any moles. Be sure to tell your health care provider: ? About any new moles or changes in moles, especially if there is a change in a mole's shape or color. ? If you have a mole that is larger than the size of a pencil eraser.  If any of your family members has a history of skin cancer, especially at a young age, talk with your health care provider about genetic screening.  Always use sunscreen. Apply sunscreen liberally and repeatedly throughout the day.  Whenever you are outside, protect yourself by wearing long sleeves, pants, a wide-brimmed hat, and sunglasses.  What should I know about osteoporosis? Osteoporosis is a condition in which bone destruction happens more quickly than new bone creation. After menopause, you may be at an increased risk for osteoporosis. To help prevent osteoporosis or the bone fractures that can happen because of osteoporosis, the following is recommended:  If you are 46-71 years old, get at least 1,000 mg of calcium and at least 600 mg of vitamin D per day.  If you are older than age 55 but younger than age 65, get at least 1,200 mg of calcium and at least 600 mg of vitamin D per day.  If you are older than age 54, get at least 1,200 mg of calcium and at least 800 mg of vitamin D per day.  Smoking and excessive alcohol intake increase the risk of osteoporosis. Eat foods that are rich in calcium and vitamin D, and do weight-bearing exercises several times each week as directed by your health care provider. What should I know about how menopause affects my mental health? Depression may occur at any age, but it is more common as you become older. Common symptoms of depression  include:  Low or sad mood.  Changes in sleep patterns.  Changes in appetite or eating patterns.  Feeling an overall lack of motivation or enjoyment of activities that you previously enjoyed.  Frequent crying spells.  Talk with your health care provider if you think that you are experiencing depression. What should I know about immunizations? It is important that you get and maintain your immunizations. These include:  Tetanus, diphtheria, and pertussis (Tdap) booster vaccine.  Influenza every year before the flu season begins.  Pneumonia vaccine.  Shingles vaccine.  Your health care provider may also recommend other immunizations. This information is not intended to replace advice given to you by your health care provider. Make sure you discuss any questions you have with your health care provider. Document Released: 10/20/2005  Document Revised: 03/17/2016 Document Reviewed: 06/01/2015 [employer] Interactive Patient Education  2018 [employer]  

## 2017-04-24 DIAGNOSIS — H2513 Age-related nuclear cataract, bilateral: Secondary | ICD-10-CM | POA: Diagnosis not present

## 2017-04-24 DIAGNOSIS — H25013 Cortical age-related cataract, bilateral: Secondary | ICD-10-CM | POA: Diagnosis not present

## 2017-05-15 ENCOUNTER — Other Ambulatory Visit: Payer: Self-pay | Admitting: Dermatology

## 2017-05-15 DIAGNOSIS — L570 Actinic keratosis: Secondary | ICD-10-CM | POA: Diagnosis not present

## 2017-05-15 DIAGNOSIS — C44729 Squamous cell carcinoma of skin of left lower limb, including hip: Secondary | ICD-10-CM | POA: Diagnosis not present

## 2017-05-30 ENCOUNTER — Other Ambulatory Visit: Payer: Self-pay | Admitting: Internal Medicine

## 2017-05-30 DIAGNOSIS — F419 Anxiety disorder, unspecified: Principal | ICD-10-CM

## 2017-05-30 DIAGNOSIS — F329 Major depressive disorder, single episode, unspecified: Secondary | ICD-10-CM

## 2017-05-30 DIAGNOSIS — F32A Depression, unspecified: Secondary | ICD-10-CM

## 2017-05-31 DIAGNOSIS — R69 Illness, unspecified: Secondary | ICD-10-CM | POA: Diagnosis not present

## 2017-06-10 DIAGNOSIS — R69 Illness, unspecified: Secondary | ICD-10-CM | POA: Diagnosis not present

## 2017-10-23 ENCOUNTER — Other Ambulatory Visit: Payer: Self-pay | Admitting: Internal Medicine

## 2017-10-23 DIAGNOSIS — Z1231 Encounter for screening mammogram for malignant neoplasm of breast: Secondary | ICD-10-CM

## 2017-11-26 ENCOUNTER — Ambulatory Visit
Admission: RE | Admit: 2017-11-26 | Discharge: 2017-11-26 | Disposition: A | Discharge status: Home / Self Care | Payer: Medicare HMO | Source: Ambulatory Visit | Attending: Internal Medicine | Admitting: Internal Medicine

## 2017-11-26 DIAGNOSIS — Z1231 Encounter for screening mammogram for malignant neoplasm of breast: Secondary | ICD-10-CM

## 2017-12-06 DIAGNOSIS — L570 Actinic keratosis: Secondary | ICD-10-CM | POA: Diagnosis not present

## 2017-12-06 DIAGNOSIS — L821 Other seborrheic keratosis: Secondary | ICD-10-CM | POA: Diagnosis not present

## 2017-12-06 DIAGNOSIS — Z85828 Personal history of other malignant neoplasm of skin: Secondary | ICD-10-CM | POA: Diagnosis not present

## 2017-12-06 DIAGNOSIS — D225 Melanocytic nevi of trunk: Secondary | ICD-10-CM | POA: Diagnosis not present

## 2017-12-06 DIAGNOSIS — D1801 Hemangioma of skin and subcutaneous tissue: Secondary | ICD-10-CM | POA: Diagnosis not present

## 2017-12-14 ENCOUNTER — Other Ambulatory Visit: Payer: Self-pay | Admitting: Internal Medicine

## 2017-12-14 DIAGNOSIS — I10 Essential (primary) hypertension: Secondary | ICD-10-CM

## 2017-12-20 DIAGNOSIS — R69 Illness, unspecified: Secondary | ICD-10-CM | POA: Diagnosis not present

## 2017-12-24 ENCOUNTER — Ambulatory Visit: Payer: Medicare HMO | Admitting: Family Medicine

## 2017-12-24 ENCOUNTER — Encounter: Payer: Self-pay | Admitting: *Deleted

## 2017-12-24 DIAGNOSIS — Z0289 Encounter for other administrative examinations: Secondary | ICD-10-CM

## 2017-12-31 ENCOUNTER — Encounter: Payer: Self-pay | Admitting: Internal Medicine

## 2017-12-31 ENCOUNTER — Ambulatory Visit (INDEPENDENT_AMBULATORY_CARE_PROVIDER_SITE_OTHER): Payer: Medicare HMO | Admitting: Internal Medicine

## 2017-12-31 VITALS — BP 138/74 | HR 91 | Temp 97.5°F | Wt 157.0 lb

## 2017-12-31 DIAGNOSIS — R0981 Nasal congestion: Secondary | ICD-10-CM

## 2017-12-31 DIAGNOSIS — R519 Headache, unspecified: Secondary | ICD-10-CM

## 2017-12-31 DIAGNOSIS — R51 Headache: Secondary | ICD-10-CM

## 2017-12-31 DIAGNOSIS — I10 Essential (primary) hypertension: Secondary | ICD-10-CM

## 2017-12-31 DIAGNOSIS — R195 Other fecal abnormalities: Secondary | ICD-10-CM | POA: Diagnosis not present

## 2017-12-31 NOTE — Progress Notes (Signed)
Subjective:    Patient ID: Monica Fuller, female    DOB: 27-Aug-1941, 77 y.o.   MRN: [id number]  HPI  Pt presents to the clinic today with c/o elevated blood pressure.  She has noticed this over the last month. She has noticed that her blood pressure has been as high as 190/90. She has had a few headaches, but denies dizziness, visual changes, chest pain or shortness of breath. She does have some nasal congestion but denies runny nose. She is not taking anything OTC for allergies at this time. She is taking Triamterene HCT as prescribed. ECG from 03/2015 reviewed. Her BP today is 138/74.  She also reports loose stools for the last few months. She denies nausea, vomiting, abdominal pain or blood in her stool. She denies changes in diet or medication. She has not taken anything OTC for her symptoms.  Review of Systems  Past Medical History:  Diagnosis Date  . Allergy   . Arthritis   . Depression   . Hypertension     Current Outpatient Medications  Medication Sig Dispense Refill  . aspirin 81 MG tablet Take 81 mg by mouth daily.      . Calcium Carbonate (CALCIUM 600 PO) Take 2 capsules by mouth daily.    . cholecalciferol (VITAMIN D) 1000 UNITS tablet Take 1,000 Units by mouth daily.    . clonazePAM (KLONOPIN) 0.5 MG tablet Take 1 tablet (0.5 mg total) by mouth at bedtime. 30 tablet 0  . fish oil-omega-3 fatty acids 1000 MG capsule Take 1,000 mg by mouth daily.      . Magnesium 500 MG CAPS Take 1 capsule by mouth daily.    . Multiple Vitamin (MULTIVITAMIN) capsule Take 1 capsule by mouth daily.      . Red Yeast Rice Extract (RED YEAST RICE PO) Take 1 capsule by mouth daily.    . sertraline (ZOLOFT) 100 MG tablet Take 1 tablet (100 mg total) by mouth daily. 90 tablet 3  . sertraline (ZOLOFT) 100 MG tablet Take 1.5 tablets (150 mg total) by mouth daily. 135 tablet 3  . triamterene-hydrochlorothiazide (MAXZIDE) 75-50 MG tablet TAKE 1 TABLET BY MOUTH DAILY 90 tablet 1  . vitamin B-12  (CYANOCOBALAMIN) 1000 MCG tablet Take 1,000 mcg by mouth daily.     No current facility-administered medications for this visit.     Allergies  Allergen Reactions  . Codeine Anxiety    Family History  Problem Relation Age of Onset  . Hypertension Sister   . Hypertension Brother   . Diabetes Brother   . Cancer Mother        CERVICAL  . Heart disease Mother   . Stroke Mother   . Hypertension Mother   . Heart disease Father   . Hypertension Father   . Diabetes Father   . Breast cancer Other     Social History   Socioeconomic History  . Marital status: Widowed    Spouse name: Not on file  . Number of children: Not on file  . Years of education: Not on file  . Highest education level: Not on file  Occupational History  . Not on file  Social Needs  . Financial resource strain: Not on file  . Food insecurity:    Worry: Not on file    Inability: Not on file  . Transportation needs:    Medical: Not on file    Non-medical: Not on file  Tobacco Use  . Smoking status: Never Smoker  .  Smokeless tobacco: Never Used  Substance and Sexual Activity  . Alcohol use: No  . Drug use: Not on file  . Sexual activity: Never  Lifestyle  . Physical activity:    Days per week: Not on file    Minutes per session: Not on file  . Stress: Not on file  Relationships  . Social connections:    Talks on phone: Not on file    Gets together: Not on file    Attends religious service: Not on file    Active member of club or organization: Not on file    Attends meetings of clubs or organizations: Not on file    Relationship status: Not on file  . Intimate partner violence:    Fear of current or ex partner: Not on file    Emotionally abused: Not on file    Physically abused: Not on file    Forced sexual activity: Not on file  Other Topics Concern  . Not on file  Social History Narrative  . Not on file     Constitutional: Pt reports headahce. Denies fever, malaise, fatigue, or  abrupt weight changes.  HEENT: Pt reports nasal congestion. Denies eye pain, eye redness, ear pain, ringing in the ears, wax buildup, runny nose, nasal congestion, bloody nose, or sore throat. Respiratory: Denies difficulty breathing, shortness of breath, cough or sputum production.   Cardiovascular: Denies chest pain, chest tightness, palpitations or swelling in the hands or feet.  Gastrointestinal: Pt reports loose stools. Denies abdominal pain, bloating, constipation, or blood in the stool.  Neurological: Denies dizziness, difficulty with memory, difficulty with speech or problems with balance and coordination.    No other specific complaints in a complete review of systems (except as listed in HPI above).     Objective:   Physical Exam  BP 138/74 (BP Location: Right Arm, Patient Position: Sitting, Cuff Size: Normal)   Pulse 91   Temp (!) 97.5 F (36.4 C) (Oral)   Wt 157 lb (71.2 kg)   SpO2 97%   BMI 29.18 kg/m  Wt Readings from Last 3 Encounters:  12/31/17 157 lb (71.2 kg)  04/12/17 157 lb 8 oz (71.4 kg)  07/04/16 155 lb 12 oz (70.6 kg)    General: Appears her stated age, well developed, well nourished in NAD. HEENT: Head: normal shape and size, mild frontal sinus tenderness noted; Nose: mucosa boggy and moist, turbinates swollen; Throat/Mouth: Teeth present, mucosa pink and moist, no exudate, lesions or ulcerations noted.  Cardiovascular: Normal rate and rhythm. S1,S2 noted.  No murmur, rubs or gallops noted.  Pulmonary/Chest: Normal effort and positive vesicular breath sounds. No respiratory distress. No wheezes, rales or ronchi noted.  Abdomen: Soft and nontender. Normal bowel sounds. No distention or masses noted.  Neurological: Alert and oriented.    BMET    Component Value Date/Time   NA 139 04/12/2017 0856   K 4.2 04/12/2017 0856   CL 101 04/12/2017 0856   CO2 31 04/12/2017 0856   GLUCOSE 94 04/12/2017 0856   BUN 20 04/12/2017 0856   CREATININE 1.04 04/12/2017  0856   CALCIUM 9.8 04/12/2017 0856   GFRNONAA >60 02/14/2009 0450   GFRAA  02/14/2009 0450    >60        The eGFR has been calculated using the MDRD equation. This calculation has not been validated in all clinical situations. eGFR's persistently <60 mL/min signify possible Chronic Kidney Disease.    Lipid Panel     Component Value  Date/Time   CHOL 230 (H) 04/12/2017 0856   TRIG 126.0 04/12/2017 0856   HDL 73.30 04/12/2017 0856   CHOLHDL 3 04/12/2017 0856   VLDL 25.2 04/12/2017 0856   LDLCALC 132 (H) 04/12/2017 0856    CBC    Component Value Date/Time   WBC 3.7 (L) 04/12/2017 0856   RBC 4.21 04/12/2017 0856   HGB 13.3 04/12/2017 0856   HCT 39.4 04/12/2017 0856   PLT 221.0 04/12/2017 0856   MCV 93.6 04/12/2017 0856   MCHC 33.6 04/12/2017 0856   RDW 13.3 04/12/2017 0856    Hgb A1C Lab Results  Component Value Date   HGBA1C 5.7 09/29/2013            Assessment & Plan:   Headache, Nasal Congestion:  ? Sinus related Start Flonase daily x 1-2 weeks  Elevated Blood Pressure/HTN:  Normal today Continue Triamterene HCT for now Reinforced DASH diet Bring BP cuff to next appt to compare with manual cuff  Loose Stools:  Try adding in fiber to your diet to bulk up the stool Let me know if no improvement  Return precautions discussed Webb Silversmith, NP

## 2017-12-31 NOTE — Patient Instructions (Signed)
High-Fiber Diet  Fiber, also called dietary fiber, is a type of carbohydrate found in fruits, vegetables, whole grains, and beans. A high-fiber diet can have many health benefits. Your health care provider may recommend a high-fiber diet to help:  · Prevent constipation. Fiber can make your bowel movements more regular.  · Lower your cholesterol.  · Relieve hemorrhoids, uncomplicated diverticulosis, or irritable bowel syndrome.  · Prevent overeating as part of a weight-loss plan.  · Prevent heart disease, type 2 diabetes, and certain cancers.    What is my plan?  The recommended daily intake of fiber includes:  · 38 grams for men under age 50.  · 30 grams for men over age 50.  · 25 grams for women under age 50.  · 21 grams for women over age 50.    You can get the recommended daily intake of dietary fiber by eating a variety of fruits, vegetables, grains, and beans. Your health care provider may also recommend a fiber supplement if it is not possible to get enough fiber through your diet.  What do I need to know about a high-fiber diet?  · Fiber supplements have not been widely studied for their effectiveness, so it is better to get fiber through food sources.  · Always check the fiber content on the nutrition facts label of any prepackaged food. Look for foods that contain at least 5 grams of fiber per serving.  · Ask your dietitian if you have questions about specific foods that are related to your condition, especially if those foods are not listed in the following section.  · Increase your daily fiber consumption gradually. Increasing your intake of dietary fiber too quickly may cause bloating, cramping, or gas.  · Drink plenty of water. Water helps you to digest fiber.  What foods can I eat?  Grains  Whole-grain breads. Multigrain cereal. Oats and oatmeal. Brown rice. Barley. Bulgur wheat. Millet. Bran muffins. Popcorn. Rye wafer crackers.  Vegetables   Sweet potatoes. Spinach. Kale. Artichokes. Cabbage. Broccoli. Green peas. Carrots. Squash.  Fruits  Berries. Pears. Apples. Oranges. Avocados. Prunes and raisins. Dried figs.  Meats and Other Protein Sources  Navy, kidney, pinto, and soy beans. Split peas. Lentils. Nuts and seeds.  Dairy  Fiber-fortified yogurt.  Beverages  Fiber-fortified soy milk. Fiber-fortified orange juice.  Other  Fiber bars.  The items listed above may not be a complete list of recommended foods or beverages. Contact your dietitian for more options.  What foods are not recommended?  Grains  White bread. Pasta made with refined flour. White rice.  Vegetables  Fried potatoes. Canned vegetables. Well-cooked vegetables.  Fruits  Fruit juice. Cooked, strained fruit.  Meats and Other Protein Sources  Fatty cuts of meat. Fried poultry or fried fish.  Dairy  Milk. Yogurt. Cream cheese. Sour cream.  Beverages  Soft drinks.  Other  Cakes and pastries. Butter and oils.  The items listed above may not be a complete list of foods and beverages to avoid. Contact your dietitian for more information.  What are some tips for including high-fiber foods in my diet?  · Eat a wide variety of high-fiber foods.  · Make sure that half of all grains consumed each day are whole grains.  · Replace breads and cereals made from refined flour or white flour with whole-grain breads and cereals.  · Replace white rice with brown rice, bulgur wheat, or millet.  · Start the day with a breakfast that is high in fiber,   such as a cereal that contains at least 5 grams of fiber per serving.  · Use beans in place of meat in soups, salads, or pasta.  · Eat high-fiber snacks, such as berries, raw vegetables, nuts, or popcorn.  This information is not intended to replace advice given to you by your health care provider. Make sure you discuss any questions you have with your health care provider.  Document Released: 08/28/2005 Document Revised: 02/03/2016 Document Reviewed: 02/10/2014   Elsevier Interactive Patient Education © 2018 [employer]

## 2018-04-18 ENCOUNTER — Other Ambulatory Visit: Payer: Self-pay | Admitting: Internal Medicine

## 2018-04-18 ENCOUNTER — Ambulatory Visit (INDEPENDENT_AMBULATORY_CARE_PROVIDER_SITE_OTHER): Payer: Medicare HMO | Admitting: Internal Medicine

## 2018-04-18 ENCOUNTER — Encounter: Payer: Self-pay | Admitting: Internal Medicine

## 2018-04-18 VITALS — BP 132/80 | HR 66 | Temp 97.9°F | Ht 61.5 in | Wt 158.0 lb

## 2018-04-18 DIAGNOSIS — M8580 Other specified disorders of bone density and structure, unspecified site: Secondary | ICD-10-CM | POA: Diagnosis not present

## 2018-04-18 DIAGNOSIS — R69 Illness, unspecified: Secondary | ICD-10-CM | POA: Diagnosis not present

## 2018-04-18 DIAGNOSIS — F32A Depression, unspecified: Secondary | ICD-10-CM

## 2018-04-18 DIAGNOSIS — F419 Anxiety disorder, unspecified: Secondary | ICD-10-CM | POA: Diagnosis not present

## 2018-04-18 DIAGNOSIS — R300 Dysuria: Secondary | ICD-10-CM | POA: Diagnosis not present

## 2018-04-18 DIAGNOSIS — E7800 Pure hypercholesterolemia, unspecified: Secondary | ICD-10-CM | POA: Diagnosis not present

## 2018-04-18 DIAGNOSIS — M810 Age-related osteoporosis without current pathological fracture: Secondary | ICD-10-CM | POA: Diagnosis not present

## 2018-04-18 DIAGNOSIS — Z0000 Encounter for general adult medical examination without abnormal findings: Secondary | ICD-10-CM | POA: Diagnosis not present

## 2018-04-18 DIAGNOSIS — I10 Essential (primary) hypertension: Secondary | ICD-10-CM

## 2018-04-18 DIAGNOSIS — F329 Major depressive disorder, single episode, unspecified: Secondary | ICD-10-CM

## 2018-04-18 LAB — LIPID PANEL
CHOLESTEROL: 243 mg/dL — AB (ref 0–200)
HDL: 75.8 mg/dL (ref >39.00)
LDL CALC: 145 mg/dL — AB (ref 0–99)
NonHDL: 167.54
TRIGLYCERIDES: 115 mg/dL (ref 0.0–149.0)
Total CHOL/HDL Ratio: 3
VLDL: 23 mg/dL (ref 0.0–40.0)

## 2018-04-18 LAB — COMPREHENSIVE METABOLIC PANEL
ALBUMIN: 4.6 g/dL (ref 3.5–5.2)
ALK PHOS: 50 U/L (ref 39–117)
ALT: 19 U/L (ref 0–35)
AST: 21 U/L (ref 0–37)
BUN: 31 mg/dL — AB (ref 6–23)
CALCIUM: 10.4 mg/dL (ref 8.4–10.5)
CHLORIDE: 101 meq/L (ref 96–112)
CO2: 31 mEq/L (ref 19–32)
CREATININE: 1.09 mg/dL (ref 0.40–1.20)
GFR: 51.4 mL/min — ABNORMAL LOW (ref >60.00)
Glucose, Bld: 107 mg/dL — ABNORMAL HIGH (ref 70–99)
POTASSIUM: 4.9 meq/L (ref 3.5–5.1)
SODIUM: 139 meq/L (ref 135–145)
TOTAL PROTEIN: 7.5 g/dL (ref 6.0–8.3)
Total Bilirubin: 0.7 mg/dL (ref 0.2–1.2)

## 2018-04-18 LAB — POC URINALSYSI DIPSTICK (AUTOMATED)
BILIRUBIN UA: NEGATIVE
Blood, UA: NEGATIVE
GLUCOSE UA: NEGATIVE
Ketones, UA: NEGATIVE
LEUKOCYTES UA: NEGATIVE
NITRITE UA: NEGATIVE
Protein, UA: NEGATIVE
Spec Grav, UA: 1.015 (ref 1.010–1.025)
UROBILINOGEN UA: 0.2 U/dL
pH, UA: 6 (ref 5.0–8.0)

## 2018-04-18 LAB — CBC
HEMATOCRIT: 38.7 % (ref 36.0–46.0)
Hemoglobin: 13.1 g/dL (ref 12.0–15.0)
MCHC: 33.9 g/dL (ref 30.0–36.0)
MCV: 92.4 fl (ref 78.0–100.0)
PLATELETS: 242 10*3/uL (ref 150.0–400.0)
RBC: 4.19 Mil/uL (ref 3.87–5.11)
RDW: 13.3 % (ref 11.5–15.5)
WBC: 4.6 10*3/uL (ref 4.0–10.5)

## 2018-04-18 LAB — VITAMIN D 25 HYDROXY (VIT D DEFICIENCY, FRACTURES): VITD: 63.13 ng/mL (ref 30.00–100.00)

## 2018-04-18 MED ORDER — BUPROPION HCL ER (XL) 150 MG PO TB24: 150.0000 mg | ORAL_TABLET | Freq: Every day | ORAL | 0 refills | Status: DC

## 2018-04-18 MED ORDER — CLONAZEPAM 0.5 MG PO TABS: 0.5000 mg | ORAL_TABLET | Freq: Every day | ORAL | 0 refills | Status: DC

## 2018-04-18 MED ORDER — BUPROPION HCL ER (XL) 150 MG PO TB24: 150.0000 mg | ORAL_TABLET | Freq: Every day | ORAL | 2 refills | Status: DC

## 2018-04-18 NOTE — Assessment & Plan Note (Signed)
Continue Calcium and Vit D Continue weight bearing exercise daily Vit D today

## 2018-04-18 NOTE — Assessment & Plan Note (Signed)
CMET and Lipid profile today Encouraged her to consume a low fat diet Continue red yeast rice

## 2018-04-18 NOTE — Addendum Note (Signed)
Addended by: Lurlean Nanny on: 04/18/2018 02:02 PM   Modules accepted: Orders

## 2018-04-18 NOTE — Progress Notes (Signed)
HPI:  Pt presents to the clinic today for her Medicare Wellness Exam. She is also due to follow up chronic conditions.  Arthritis: Mainly in her hands. She takes Advil as needed with good relief.  Anxiety and Depression: Chronic. She is taking Sertraline as prescribed but does not feel like it is very effective. She has been feeling more down and depressed lately. She takes Clonazepam as needed at bedtime.  HTN: Her BP today is 132/80. She is taking Triamterene HCT as prescribed. ECG from 03/2015 reviewed.  HLD: Her last LDL was. 132/04/2017 She is statin intolerant. She takes [employer]. She does not consume a low fat diet.  Osteopenia: Bone density from 08/2014 reviewed. She is taking Calcium and Vit D as prescribed. She is getting 30 minutes of weight bearing exercise daily.  She also c/o dysuria. This is intermittent. She has a lot of bladder pressure and has noticed some dribbling. She denies urgency, frequency, incontinence or blood in her urine. She denies vaginal complaints. She has not taken anything OTC for this.  Past Medical History:  Diagnosis Date  . Allergy   . Arthritis   . Depression   . Hypertension     Current Outpatient Medications  Medication Sig Dispense Refill  . aspirin 81 MG tablet Take 81 mg by mouth daily.      . Calcium Carbonate (CALCIUM 600 PO) Take 2 capsules by mouth daily.    . cholecalciferol (VITAMIN D) 1000 UNITS tablet Take 1,000 Units by mouth daily.    . clonazePAM (KLONOPIN) 0.5 MG tablet Take 1 tablet (0.5 mg total) by mouth at bedtime. 30 tablet 0  . fish oil-omega-3 fatty acids 1000 MG capsule Take 1,000 mg by mouth daily.      . Magnesium 500 MG CAPS Take 1 capsule by mouth daily.    . Multiple Vitamin (MULTIVITAMIN) capsule Take 1 capsule by mouth daily.      . Red Yeast Rice Extract (RED YEAST RICE PO) Take 1 capsule by mouth daily.    . sertraline (ZOLOFT) 100 MG tablet Take 1 tablet (100 mg total) by mouth daily. 90 tablet 3  .  sertraline (ZOLOFT) 100 MG tablet Take 1.5 tablets (150 mg total) by mouth daily. 135 tablet 3  . triamterene-hydrochlorothiazide (MAXZIDE) 75-50 MG tablet TAKE 1 TABLET BY MOUTH DAILY 90 tablet 1  . vitamin B-12 (CYANOCOBALAMIN) 1000 MCG tablet Take 1,000 mcg by mouth daily.     No current facility-administered medications for this visit.     Allergies  Allergen Reactions  . Codeine Anxiety    Family History  Problem Relation Age of Onset  . Hypertension Sister   . Hypertension Brother   . Diabetes Brother   . Cancer Mother        CERVICAL  . Heart disease Mother   . Stroke Mother   . Hypertension Mother   . Heart disease Father   . Hypertension Father   . Diabetes Father   . Breast cancer Other     Social History   Socioeconomic History  . Marital status: Widowed    Spouse name: Not on file  . Number of children: Not on file  . Years of education: Not on file  . Highest education level: Not on file  Occupational History  . Not on file  Social Needs  . Financial resource strain: Not on file  . Food insecurity:    Worry: Not on file    Inability: Not on  file  . Transportation needs:    Medical: Not on file    Non-medical: Not on file  Tobacco Use  . Smoking status: Never Smoker  . Smokeless tobacco: Never Used  Substance and Sexual Activity  . Alcohol use: No  . Drug use: Not on file  . Sexual activity: Never  Lifestyle  . Physical activity:    Days per week: Not on file    Minutes per session: Not on file  . Stress: Not on file  Relationships  . Social connections:    Talks on phone: Not on file    Gets together: Not on file    Attends religious service: Not on file    Active member of club or organization: Not on file    Attends meetings of clubs or organizations: Not on file    Relationship status: Not on file  . Intimate partner violence:    Fear of current or ex partner: Not on file    Emotionally abused: Not on file    Physically abused: Not  on file    Forced sexual activity: Not on file  Other Topics Concern  . Not on file  Social History Narrative  . Not on file    Hospitiliaztions: None  Health Maintenance:    Flu: 06/2015  Tetanus: 04/2011  Pneumovax: 04/2008  Prevnar: 03/2015  Zostavax: 04/2011  Shingrix: never  Mammogram: 11/2017  Pap Smear: 03/2011  Bone Density: 08/2014  Colon Screening: 03/2012  Eye Doctor: annually  Dental Exam: biannually   Providers:   PCP: Webb Silversmith, NP-C     I have personally reviewed and have noted:  1. The patient's medical and social history 2. Their use of alcohol, tobacco or illicit drugs 3. Their current medications and supplements 4. The patient's functional ability including ADL's, fall risks, home safety risks and hearing or visual impairment. 5. Diet and physical activities 6. Evidence for depression or mood disorder  Subjective:   Review of Systems:   Constitutional: Denies fever, malaise, fatigue, headache or abrupt weight changes.  HEENT: Denies eye pain, eye redness, ear pain, ringing in the ears, wax buildup, runny nose, nasal congestion, bloody nose, or sore throat. Respiratory: Pt reports intermittent shortness of breath. Denies difficulty breathing, cough or sputum production.   Cardiovascular: Denies chest pain, chest tightness, palpitations or swelling in the hands or feet.  Gastrointestinal: Pt reports intermittent loose stools. Denies abdominal pain, bloating, constipation, or blood in the stool.  GU: Pt reports dysuria, dribbling and bladder pressure. Denies urgency, frequency, burning sensation, blood in urine, odor or discharge. Musculoskeletal: Denies decrease in range of motion, difficulty with gait, muscle pain or joint pain and swelling.  Skin: Denies redness, rashes, lesions or ulcercations.  Neurological: Denies dizziness, difficulty with memory, difficulty with speech or problems with balance and coordination.  Psych: Pt reports a history of  anxiety and depression. Denies SI/HI.  No other specific complaints in a complete review of systems (except as listed in HPI above).  Objective:  PE:  BP 132/80   Pulse 66   Temp 97.9 F (36.6 C) (Oral)   Ht 5' 1.5" (1.562 m)   Wt 158 lb (71.7 kg)   SpO2 97%   BMI 29.37 kg/m   Wt Readings from Last 3 Encounters:  12/31/17 157 lb (71.2 kg)  04/12/17 157 lb 8 oz (71.4 kg)  07/04/16 155 lb 12 oz (70.6 kg)    General: Appears herstated age, well developed, well nourished in NAD.  Skin: Warm, dry and intact. No rashes, lesions or ulcerations noted. HEENT: Head: normal shape and size; Eyes: sclera white, no icterus, conjunctiva pink, PERRLA and EOMs intact; Ears: Tm's gray and intact, normal light reflex; Throat/Mouth: Teeth present, mucosa pink and moist, no exudate, lesions or ulcerations noted.  Neck: Neck supple, trachea midline. No masses, lumps or thyromegaly present.  Cardiovascular: Normal rate and rhythm. S1,S2 noted.  No murmur, rubs or gallops noted. No JVD or BLE edema. No carotid bruits noted. Pulmonary/Chest: Normal effort and positive vesicular breath sounds. No respiratory distress. No wheezes, rales or ronchi noted.  Abdomen: Soft and nontender. Normal bowel sounds. No distention or masses noted. Liver, spleen and kidneys non palpable. Musculoskeletal: Strength 5/5 BUE/BLE. No signs of joint swelling.  Neurological: Alert and oriented. Cranial nerves II-XII grossly intact. Coordination normal.  Psychiatric: Mood and affect normal. Behavior is normal. Judgment and thought content normal.     BMET    Component Value Date/Time   NA 139 04/12/2017 0856   K 4.2 04/12/2017 0856   CL 101 04/12/2017 0856   CO2 31 04/12/2017 0856   GLUCOSE 94 04/12/2017 0856   BUN 20 04/12/2017 0856   CREATININE 1.04 04/12/2017 0856   CALCIUM 9.8 04/12/2017 0856   GFRNONAA >60 02/14/2009 0450   GFRAA  02/14/2009 0450    >60        The eGFR has been calculated using the MDRD  equation. This calculation has not been validated in all clinical situations. eGFR's persistently <60 mL/min signify possible Chronic Kidney Disease.    Lipid Panel     Component Value Date/Time   CHOL 230 (H) 04/12/2017 0856   TRIG 126.0 04/12/2017 0856   HDL 73.30 04/12/2017 0856   CHOLHDL 3 04/12/2017 0856   VLDL 25.2 04/12/2017 0856   LDLCALC 132 (H) 04/12/2017 0856    CBC    Component Value Date/Time   WBC 3.7 (L) 04/12/2017 0856   RBC 4.21 04/12/2017 0856   HGB 13.3 04/12/2017 0856   HCT 39.4 04/12/2017 0856   PLT 221.0 04/12/2017 0856   MCV 93.6 04/12/2017 0856   MCHC 33.6 04/12/2017 0856   RDW 13.3 04/12/2017 0856    Hgb A1C Lab Results  Component Value Date   HGBA1C 5.7 09/29/2013      Assessment and Plan:   Medicare Annual Wellness Visit:  Diet:  She does eat meat. She consumes fruits and veggies daily. She does eat fried foods. She drinks mostly water and Dt. Coke. Physical activity: Sedentary Depression/mood screen: Positive, on meds Hearing: Intact to whispered voice Visual acuity: Grossly normal, performs annual eye exam  ADLs: Capable Fall risk: None Home safety: Good Cognitive evaluation: Intact to orientation, naming, recall and repetition EOL planning: Adv directives, full code/ I agree  Preventative Medicine: Encouraged her to get a flu shot in the fall. Tetanus, pneumovax, prevnar and zostovax UTD. She declines Shingrix. Mammogram UTD. She no longer screens for cervical cancer. Will repeat bone density in 2020. She does not want a colonoscopy but would like to try the Cologuard- ordered. Encouraged her to consume a balanced diet and exercise regimen. Advised her to see an eye doctor and dentist annually. Will check CBC, CMET, Lipid and Vit D today.  Dysuria:  Urinalysis: normal Will send urine culture Decrease caffeine use, increase water intake Discussed timed voiding every 2 hours  Next appointment: 1 year, Medicare Wellness  Exam   Webb Silversmith, NP

## 2018-04-18 NOTE — Patient Instructions (Signed)
Health Maintenance for Postmenopausal Women Menopause is a normal process in which your reproductive ability comes to an end. This process happens gradually over a span of months to years, usually between the ages of 22 and 9. Menopause is complete when you have missed 12 consecutive menstrual periods. It is important to talk with your health care provider about some of the most common conditions that affect postmenopausal women, such as heart disease, cancer, and bone loss (osteoporosis). Adopting a healthy lifestyle and getting preventive care can help to promote your health and wellness. Those actions can also lower your chances of developing some of these common conditions. What should I know about menopause? During menopause, you may experience a number of symptoms, such as:  Moderate-to-severe hot flashes.  Night sweats.  Decrease in sex drive.  Mood swings.  Headaches.  Tiredness.  Irritability.  Memory problems.  Insomnia.  Choosing to treat or not to treat menopausal changes is an individual decision that you make with your health care provider. What should I know about hormone replacement therapy and supplements? Hormone therapy products are effective for treating symptoms that are associated with menopause, such as hot flashes and night sweats. Hormone replacement carries certain risks, especially as you become older. If you are thinking about using estrogen or estrogen with progestin treatments, discuss the benefits and risks with your health care provider. What should I know about heart disease and stroke? Heart disease, heart attack, and stroke become more likely as you age. This may be due, in part, to the hormonal changes that your body experiences during menopause. These can affect how your body processes dietary fats, triglycerides, and cholesterol. Heart attack and stroke are both medical emergencies. There are many things that you can do to help prevent heart disease  and stroke:  Have your blood pressure checked at least every 1-2 years. High blood pressure causes heart disease and increases the risk of stroke.  If you are 53-22 years old, ask your health care provider if you should take aspirin to prevent a heart attack or a stroke.  Do not use any tobacco products, including cigarettes, chewing tobacco, or electronic cigarettes. If you need help quitting, ask your health care provider.  It is important to eat a healthy diet and maintain a healthy weight. ? Be sure to include plenty of vegetables, fruits, low-fat dairy products, and lean protein. ? Avoid eating foods that are high in solid fats, added sugars, or salt (sodium).  Get regular exercise. This is one of the most important things that you can do for your health. ? Try to exercise for at least 150 minutes each week. The type of exercise that you do should increase your heart rate and make you sweat. This is known as moderate-intensity exercise. ? Try to do strengthening exercises at least twice each week. Do these in addition to the moderate-intensity exercise.  Know your numbers.Ask your health care provider to check your cholesterol and your blood glucose. Continue to have your blood tested as directed by your health care provider.  What should I know about cancer screening? There are several types of cancer. Take the following steps to reduce your risk and to catch any cancer development as early as possible. Breast Cancer  Practice breast self-awareness. ? This means understanding how your breasts normally appear and feel. ? It also means doing regular breast self-exams. Let your health care provider know about any changes, no matter how small.  If you are 40  or older, have a clinician do a breast exam (clinical breast exam or CBE) every year. Depending on your age, family history, and medical history, it may be recommended that you also have a yearly breast X-ray (mammogram).  If you  have a family history of breast cancer, talk with your health care provider about genetic screening.  If you are at high risk for breast cancer, talk with your health care provider about having an MRI and a mammogram every year.  Breast cancer (BRCA) gene test is recommended for women who have family members with BRCA-related cancers. Results of the assessment will determine the need for genetic counseling and BRCA1 and for BRCA2 testing. BRCA-related cancers include these types: ? Breast. This occurs in males or females. ? Ovarian. ? Tubal. This may also be called fallopian tube cancer. ? Cancer of the abdominal or pelvic lining (peritoneal cancer). ? Prostate. ? Pancreatic.  Cervical, Uterine, and Ovarian Cancer Your health care provider may recommend that you be screened regularly for cancer of the pelvic organs. These include your ovaries, uterus, and vagina. This screening involves a pelvic exam, which includes checking for microscopic changes to the surface of your cervix (Pap test).  For women ages 21-65, health care providers may recommend a pelvic exam and a Pap test every three years. For women ages 79-65, they may recommend the Pap test and pelvic exam, combined with testing for human papilloma virus (HPV), every five years. Some types of HPV increase your risk of cervical cancer. Testing for HPV may also be done on women of any age who have unclear Pap test results.  Other health care providers may not recommend any screening for nonpregnant women who are considered low risk for pelvic cancer and have no symptoms. Ask your health care provider if a screening pelvic exam is right for you.  If you have had past treatment for cervical cancer or a condition that could lead to cancer, you need Pap tests and screening for cancer for at least 20 years after your treatment. If Pap tests have been discontinued for you, your risk factors (such as having a new sexual partner) need to be  reassessed to determine if you should start having screenings again. Some women have medical problems that increase the chance of getting cervical cancer. In these cases, your health care provider may recommend that you have screening and Pap tests more often.  If you have a family history of uterine cancer or ovarian cancer, talk with your health care provider about genetic screening.  If you have vaginal bleeding after reaching menopause, tell your health care provider.  There are currently no reliable tests available to screen for ovarian cancer.  Lung Cancer Lung cancer screening is recommended for adults 69-62 years old who are at high risk for lung cancer because of a history of smoking. A yearly low-dose CT scan of the lungs is recommended if you:  Currently smoke.  Have a history of at least 30 pack-years of smoking and you currently smoke or have quit within the past 15 years. A pack-year is smoking an average of one pack of cigarettes per day for one year.  Yearly screening should:  Continue until it has been 15 years since you quit.  Stop if you develop a health problem that would prevent you from having lung cancer treatment.  Colorectal Cancer  This type of cancer can be detected and can often be prevented.  Routine colorectal cancer screening usually begins at  age 42 and continues through age 45.  If you have risk factors for colon cancer, your health care provider may recommend that you be screened at an earlier age.  If you have a family history of colorectal cancer, talk with your health care provider about genetic screening.  Your health care provider may also recommend using home test kits to check for hidden blood in your stool.  A small camera at the end of a tube can be used to examine your colon directly (sigmoidoscopy or colonoscopy). This is done to check for the earliest forms of colorectal cancer.  Direct examination of the colon should be repeated every  5-10 years until age 71. However, if early forms of precancerous polyps or small growths are found or if you have a family history or genetic risk for colorectal cancer, you may need to be screened more often.  Skin Cancer  Check your skin from head to toe regularly.  Monitor any moles. Be sure to tell your health care provider: ? About any new moles or changes in moles, especially if there is a change in a mole's shape or color. ? If you have a mole that is larger than the size of a pencil eraser.  If any of your family members has a history of skin cancer, especially at a young age, talk with your health care provider about genetic screening.  Always use sunscreen. Apply sunscreen liberally and repeatedly throughout the day.  Whenever you are outside, protect yourself by wearing long sleeves, pants, a wide-brimmed hat, and sunglasses.  What should I know about osteoporosis? Osteoporosis is a condition in which bone destruction happens more quickly than new bone creation. After menopause, you may be at an increased risk for osteoporosis. To help prevent osteoporosis or the bone fractures that can happen because of osteoporosis, the following is recommended:  If you are 46-71 years old, get at least 1,000 mg of calcium and at least 600 mg of vitamin D per day.  If you are older than age 55 but younger than age 65, get at least 1,200 mg of calcium and at least 600 mg of vitamin D per day.  If you are older than age 54, get at least 1,200 mg of calcium and at least 800 mg of vitamin D per day.  Smoking and excessive alcohol intake increase the risk of osteoporosis. Eat foods that are rich in calcium and vitamin D, and do weight-bearing exercises several times each week as directed by your health care provider. What should I know about how menopause affects my mental health? Depression may occur at any age, but it is more common as you become older. Common symptoms of depression  include:  Low or sad mood.  Changes in sleep patterns.  Changes in appetite or eating patterns.  Feeling an overall lack of motivation or enjoyment of activities that you previously enjoyed.  Frequent crying spells.  Talk with your health care provider if you think that you are experiencing depression. What should I know about immunizations? It is important that you get and maintain your immunizations. These include:  Tetanus, diphtheria, and pertussis (Tdap) booster vaccine.  Influenza every year before the flu season begins.  Pneumonia vaccine.  Shingles vaccine.  Your health care provider may also recommend other immunizations. This information is not intended to replace advice given to you by your health care provider. Make sure you discuss any questions you have with your health care provider. Document Released: 10/20/2005  Document Revised: 03/17/2016 Document Reviewed: 06/01/2015 [employer] Interactive Patient Education  2018 [employer]  

## 2018-04-18 NOTE — Assessment & Plan Note (Signed)
Deteriorated Continue Sertraline Will add Wellbutrin 150 mg daily Continue Clonazepam, refilled today  Update me in 1 month and let me know how you are feeling

## 2018-04-18 NOTE — Assessment & Plan Note (Signed)
Stable on Triamterene-HCT Reinforced DASH diet CBC and CMET today

## 2018-04-19 LAB — URINE CULTURE
MICRO NUMBER: 90940382
SPECIMEN QUALITY:: ADEQUATE

## 2018-04-29 DIAGNOSIS — H2513 Age-related nuclear cataract, bilateral: Secondary | ICD-10-CM | POA: Diagnosis not present

## 2018-04-29 DIAGNOSIS — H25013 Cortical age-related cataract, bilateral: Secondary | ICD-10-CM | POA: Diagnosis not present

## 2018-04-29 DIAGNOSIS — H5203 Hypermetropia, bilateral: Secondary | ICD-10-CM | POA: Diagnosis not present

## 2018-04-29 DIAGNOSIS — H25033 Anterior subcapsular polar age-related cataract, bilateral: Secondary | ICD-10-CM | POA: Diagnosis not present

## 2018-04-29 DIAGNOSIS — H524 Presbyopia: Secondary | ICD-10-CM | POA: Diagnosis not present

## 2018-04-30 ENCOUNTER — Telehealth: Payer: Self-pay

## 2018-04-30 NOTE — Telephone Encounter (Signed)
Copied from [place] [id number]. Topic: Inquiry >> Apr 30, 2018  9:07 AM Monica Fuller wrote: Reason for CRM:   Pt states she has not received her Cologuard test in the mail and wants to check on that. Pt can be reached at [phone]

## 2018-05-01 ENCOUNTER — Telehealth: Payer: Self-pay

## 2018-05-01 NOTE — Telephone Encounter (Signed)
Cologuard form has been faxed w/ demographics and insurance card attached 

## 2018-05-01 NOTE — Telephone Encounter (Signed)
Order has recently been faxed, they should mail soon

## 2018-05-14 DIAGNOSIS — Z1211 Encounter for screening for malignant neoplasm of colon: Secondary | ICD-10-CM | POA: Diagnosis not present

## 2018-05-14 DIAGNOSIS — Z1212 Encounter for screening for malignant neoplasm of rectum: Secondary | ICD-10-CM | POA: Diagnosis not present

## 2018-05-17 LAB — COLOGUARD: Cologuard: NEGATIVE

## 2018-05-27 ENCOUNTER — Encounter: Payer: Self-pay | Admitting: Internal Medicine

## 2018-05-27 NOTE — Telephone Encounter (Signed)
Cologuard (DNA stool test) result:  NEGATIVE  Letter mailed notifying pt of results

## 2018-06-15 ENCOUNTER — Other Ambulatory Visit: Payer: Self-pay | Admitting: Internal Medicine

## 2018-06-15 DIAGNOSIS — F329 Major depressive disorder, single episode, unspecified: Secondary | ICD-10-CM

## 2018-06-15 DIAGNOSIS — F419 Anxiety disorder, unspecified: Principal | ICD-10-CM

## 2018-06-15 DIAGNOSIS — F32A Depression, unspecified: Secondary | ICD-10-CM

## 2018-06-24 DIAGNOSIS — J018 Other acute sinusitis: Secondary | ICD-10-CM | POA: Diagnosis not present

## 2018-06-24 DIAGNOSIS — K047 Periapical abscess without sinus: Secondary | ICD-10-CM | POA: Diagnosis not present

## 2018-06-24 DIAGNOSIS — J019 Acute sinusitis, unspecified: Secondary | ICD-10-CM | POA: Diagnosis not present

## 2018-07-04 DIAGNOSIS — R69 Illness, unspecified: Secondary | ICD-10-CM | POA: Diagnosis not present

## 2018-07-09 ENCOUNTER — Encounter: Payer: Self-pay | Admitting: Internal Medicine

## 2018-07-09 ENCOUNTER — Ambulatory Visit (INDEPENDENT_AMBULATORY_CARE_PROVIDER_SITE_OTHER): Payer: Medicare HMO | Admitting: Internal Medicine

## 2018-07-09 VITALS — BP 128/82 | HR 72 | Temp 97.8°F | Wt 163.0 lb

## 2018-07-09 DIAGNOSIS — J302 Other seasonal allergic rhinitis: Secondary | ICD-10-CM | POA: Diagnosis not present

## 2018-07-09 DIAGNOSIS — Z23 Encounter for immunization: Secondary | ICD-10-CM | POA: Diagnosis not present

## 2018-07-09 DIAGNOSIS — R6884 Jaw pain: Secondary | ICD-10-CM

## 2018-07-09 MED ORDER — PREDNISONE 10 MG PO TABS: ORAL_TABLET | ORAL | 0 refills | Status: DC

## 2018-07-09 NOTE — Progress Notes (Signed)
HPI  Pt presents to the clinic today with c/o nasal congestion, ear fullness and sore throat. She reports this started 1 month ago. She is blowing clear mucous out of her nose. She denies ear pain or decreased hearing. She denies difficulty swallowing but feels like she has a lot of drainage. She reports the left side of her jaw hurts. She just saw the dentist last week who took xrays and was advised nothing was wrong with her teeth. She denies fever, chills or body aches. She is taking Zyrtec with minimal relief. She was seen at [facility] for the same on 10/14. She was prescribed Augmentin for a sinus infection. She has finished the abx but denies much improvement. She has a history of allergies. She has not had sick contacts.  Review of Systems     Past Medical History:  Diagnosis Date  . Allergy   . Arthritis   . Depression   . Hypertension     Family History  Problem Relation Age of Onset  . Hypertension Sister   . Hypertension Brother   . Diabetes Brother   . Cancer Mother        CERVICAL  . Heart disease Mother   . Stroke Mother   . Hypertension Mother   . Heart disease Father   . Hypertension Father   . Diabetes Father   . Breast cancer Other     Social History   Socioeconomic History  . Marital status: Widowed    Spouse name: Not on file  . Number of children: Not on file  . Years of education: Not on file  . Highest education level: Not on file  Occupational History  . Not on file  Social Needs  . Financial resource strain: Not on file  . Food insecurity:    Worry: Not on file    Inability: Not on file  . Transportation needs:    Medical: Not on file    Non-medical: Not on file  Tobacco Use  . Smoking status: Never Smoker  . Smokeless tobacco: Never Used  Substance and Sexual Activity  . Alcohol use: No  . Drug use: Not on file  . Sexual activity: Never  Lifestyle  . Physical activity:    Days per week: Not on file    Minutes per session: Not on file  .  Stress: Not on file  Relationships  . Social connections:    Talks on phone: Not on file    Gets together: Not on file    Attends religious service: Not on file    Active member of club or organization: Not on file    Attends meetings of clubs or organizations: Not on file    Relationship status: Not on file  . Intimate partner violence:    Fear of current or ex partner: Not on file    Emotionally abused: Not on file    Physically abused: Not on file    Forced sexual activity: Not on file  Other Topics Concern  . Not on file  Social History Narrative  . Not on file    Allergies  Allergen Reactions  . Codeine Anxiety     Constitutional:  Denies headache, fatigue, fever or abrupt weight changes.  HEENT:  Positive jaw pain, nasal congestion, ear fullness and sore throat. Denies eye redness, ear pain, ringing in the ears, wax buildup, runny nose or bloody nose. Respiratory: Denies cough, difficulty breathing or shortness of breath.  Cardiovascular: Denies chest  pain, chest tightness, palpitations or swelling in the hands or feet.   No other specific complaints in a complete review of systems (except as listed in HPI above).  Objective:  BP 128/82   Pulse 72   Temp 97.8 F (36.6 C) (Oral)   Wt 163 lb (73.9 kg)   SpO2 96%   BMI 30.30 kg/m   General: Appears her stated age, well developed, well nourished in NAD. HEENT: Head: normal shape and size, no sinus tenderness noted; Ears: Tm's gray and intact, normal light reflex; Nose: mucosa boggy and moist, septum midline; Throat/Mouth: + PND. Teeth present, mucosa erythematous and moist, no exudate noted, no lesions or ulcerations noted.  Neck:  No adenopathy noted.  Pulmonary/Chest: Normal effort and positive vesicular breath sounds. No respiratory distress. No wheezes, rales or ronchi noted.  MSK: No pain with palpation of the TMJ or lower mandible.     Assessment & Plan:   Allergic Rhinitis/Left Lower Jaw Pain:  Can use a  Neti Pot which can be purchased from your local drug store. Continue Zyrtec eRx for Pred Taper x 6 days  RTC as needed or if symptoms persist. Webb Silversmith, NP

## 2018-07-09 NOTE — Patient Instructions (Signed)

## 2018-07-10 DIAGNOSIS — R6884 Jaw pain: Secondary | ICD-10-CM | POA: Diagnosis not present

## 2018-07-10 DIAGNOSIS — J302 Other seasonal allergic rhinitis: Secondary | ICD-10-CM | POA: Diagnosis not present

## 2018-07-10 DIAGNOSIS — Z23 Encounter for immunization: Secondary | ICD-10-CM | POA: Diagnosis not present

## 2018-08-20 ENCOUNTER — Ambulatory Visit (INDEPENDENT_AMBULATORY_CARE_PROVIDER_SITE_OTHER): Payer: Medicare HMO | Admitting: Internal Medicine

## 2018-08-20 ENCOUNTER — Encounter: Payer: Self-pay | Admitting: Internal Medicine

## 2018-08-20 VITALS — BP 126/80 | HR 75 | Temp 98.4°F | Wt 164.0 lb

## 2018-08-20 DIAGNOSIS — J3089 Other allergic rhinitis: Secondary | ICD-10-CM

## 2018-08-20 DIAGNOSIS — R6884 Jaw pain: Secondary | ICD-10-CM | POA: Diagnosis not present

## 2018-08-20 MED ORDER — FLUTICASONE PROPIONATE 50 MCG/ACT NA SUSP: 2.0000 | Freq: Every day | NASAL | 6 refills | Status: AC

## 2018-08-20 NOTE — Patient Instructions (Signed)

## 2018-08-20 NOTE — Progress Notes (Signed)
HPI  Pt presents to the clinic today with c/o runny nose, post nasal drip and cough. She reports this has been intermittent for months. She is blowing clear mucous out of her nose. She denies difficulty swallowing. The cough is non productive. She denies fever, chills or body aches. She has been taking Zyrtec with some relief. She has a history of allergies .She has not had sick contacts.  She also c/o persistent left sided jaw pain. This has been going on for months. She describes the pain as sore and achy. The pain does not radiate. She has no pain in her teeth or trouble chewing. She does feel like her jaw locks up on her but she has no trouble or pain with opening and closing her mouth. She has seen her dentist for the same. They did xrays which were normal according to her. She denies any injury to the area. She is not currently on any treatment for osteoporosis. She was seen for the same 10/29, treated with Prednisone. She reports it got "some better" with steroids but did not completely resolve.  Review of Systems      Past Medical History:  Diagnosis Date  . Allergy   . Arthritis   . Depression   . Hypertension     Family History  Problem Relation Age of Onset  . Hypertension Sister   . Hypertension Brother   . Diabetes Brother   . Cancer Mother        CERVICAL  . Heart disease Mother   . Stroke Mother   . Hypertension Mother   . Heart disease Father   . Hypertension Father   . Diabetes Father   . Breast cancer Other     Social History   Socioeconomic History  . Marital status: Widowed    Spouse name: Not on file  . Number of children: Not on file  . Years of education: Not on file  . Highest education level: Not on file  Occupational History  . Not on file  Social Needs  . Financial resource strain: Not on file  . Food insecurity:    Worry: Not on file    Inability: Not on file  . Transportation needs:    Medical: Not on file    Non-medical: Not on file   Tobacco Use  . Smoking status: Never Smoker  . Smokeless tobacco: Never Used  Substance and Sexual Activity  . Alcohol use: No  . Drug use: Not on file  . Sexual activity: Never  Lifestyle  . Physical activity:    Days per week: Not on file    Minutes per session: Not on file  . Stress: Not on file  Relationships  . Social connections:    Talks on phone: Not on file    Gets together: Not on file    Attends religious service: Not on file    Active member of club or organization: Not on file    Attends meetings of clubs or organizations: Not on file    Relationship status: Not on file  . Intimate partner violence:    Fear of current or ex partner: Not on file    Emotionally abused: Not on file    Physically abused: Not on file    Forced sexual activity: Not on file  Other Topics Concern  . Not on file  Social History Narrative  . Not on file    Allergies  Allergen Reactions  . Codeine Anxiety  Constitutional: Denies headache, fatigue, fever or  abrupt weight changes.  HEENT:  Positive runny nose. Denies eye redness, eye pain, pressure behind the eyes, facial pain, nasal congestion, ear pain, ringing in the ears, wax buildup, or sore throat. Respiratory: Positive cough. Denies difficulty breathing or shortness of breath.  Cardiovascular: Denies chest pain, chest tightness, palpitations or swelling in the hands or feet.  MSK: Pt reports left lower jaw pain. Denies difficulty opening or closing jaw, TMJ pain.  No other specific complaints in a complete review of systems (except as listed in HPI above).  Objective:   Pulse 75   Temp 98.4 F (36.9 C) (Oral)   Wt 164 lb (74.4 kg)   SpO2 96%   BMI 30.49 kg/m  Wt Readings from Last 3 Encounters:  08/20/18 164 lb (74.4 kg)  07/09/18 163 lb (73.9 kg)  04/18/18 158 lb (71.7 kg)     General: Appears her stated age, well developed, well nourished in NAD. HEENT: Head: normal shape and size, no sinus tenderness  noted;  Ears: Tm's gray and intact, normal light reflex; Nose: mucosa pink and moist, septum midline; Throat/Mouth: + PND. Teeth present, mucosa epink and moist, no exudate noted, no lesions or ulcerations noted.  Neck: No cervical lymphadenopathy.  Cardiovascular: Normal rate and rhythm.  Pulmonary/Chest: Normal effort and positive vesicular breath sounds. No respiratory distress. No wheezes, rales or ronchi noted.  MSK: Normal opening and closing of the jaw. No pain with palpation over the left TMJ.     Assessment & Plan:   Allergic Rhinitis:  Get some rest and drink plenty of water Continue Zyrtec Add in Flonase  Left Side Lower Jaw Pain:  Failed steroids Xray normal per her report Will obtain CT maxillofacial for further evaluation May need referral to ENT   Will follow up after CT scan is back, return precautions discussed Webb Silversmith, NP   RTC as needed or if symptoms persist.   Webb Silversmith, NP

## 2018-08-28 ENCOUNTER — Ambulatory Visit
Admission: RE | Admit: 2018-08-28 | Discharge: 2018-08-28 | Disposition: A | Discharge status: Home / Self Care | Payer: Medicare HMO | Source: Ambulatory Visit | Attending: Internal Medicine | Admitting: Internal Medicine

## 2018-08-28 DIAGNOSIS — M2669 Other specified disorders of temporomandibular joint: Secondary | ICD-10-CM | POA: Diagnosis not present

## 2018-08-28 DIAGNOSIS — R6884 Jaw pain: Secondary | ICD-10-CM

## 2018-08-29 ENCOUNTER — Other Ambulatory Visit: Payer: Medicare HMO

## 2018-08-30 ENCOUNTER — Other Ambulatory Visit: Payer: Medicare HMO

## 2018-09-13 ENCOUNTER — Other Ambulatory Visit: Payer: Self-pay | Admitting: Internal Medicine

## 2018-09-13 DIAGNOSIS — I10 Essential (primary) hypertension: Secondary | ICD-10-CM

## 2018-09-16 DIAGNOSIS — R69 Illness, unspecified: Secondary | ICD-10-CM | POA: Diagnosis not present

## 2018-09-23 ENCOUNTER — Encounter: Payer: Self-pay | Admitting: Obstetrics & Gynecology

## 2018-09-23 ENCOUNTER — Ambulatory Visit: Payer: Medicare HMO | Admitting: Obstetrics & Gynecology

## 2018-09-23 VITALS — BP 120/78 | Ht 61.0 in | Wt 148.9 lb

## 2018-09-23 DIAGNOSIS — Z124 Encounter for screening for malignant neoplasm of cervix: Secondary | ICD-10-CM

## 2018-09-23 DIAGNOSIS — Z01419 Encounter for gynecological examination (general) (routine) without abnormal findings: Secondary | ICD-10-CM | POA: Diagnosis not present

## 2018-09-23 DIAGNOSIS — Z780 Asymptomatic menopausal state: Secondary | ICD-10-CM

## 2018-09-23 DIAGNOSIS — M8589 Other specified disorders of bone density and structure, multiple sites: Secondary | ICD-10-CM | POA: Diagnosis not present

## 2018-09-23 DIAGNOSIS — R300 Dysuria: Secondary | ICD-10-CM | POA: Diagnosis not present

## 2018-09-23 NOTE — Patient Instructions (Signed)
1. Encounter for routine gynecological examination with Papanicolaou smear of cervix Normal gynecologic exam in menopause.  Pap reflex done.  Breast exam normal.  Last screening mammogram was negative in March 2019.  Cologuard negative in 2019.  Health labs with family physician.  Body mass index 28.13.  Recommend a mild decrease in calories/carb and nutrition.  Recommend increase physical activities with aerobic activities 5 times a week and light weights every 2 days.  2. Postmenopausal Well on no hormone replacement therapy.  No postmenopausal bleeding.  3. Osteopenia of multiple sites Osteopenia with a T score of -2.4 in 2015.  Will schedule a bone density here now.  Recommend vitamin D supplements, calcium intake of 1200 mg to 1500 mg daily.  Weightbearing physical activity such as walking on a regular basis. - DG Bone Density; Future  4. Dysuria Urine analysis showing only 0-5 white blood cells.  Will wait on urine culture before treating as it will probably be negative. - Urinalysis,Complete w/RFL Culture  Monica Fuller, it was a pleasure meeting you today!  I will inform you of your results as soon as they are available.

## 2018-09-23 NOTE — Progress Notes (Signed)
Monica Fuller July 31, 1941 [id number]   History:    78 y.o. G0 Widowed  RP:  Established patient presenting for annual gyn exam   HPI: Menopause, well on no hormone replacement therapy.  No postmenopausal bleeding.  No pelvic pain.  Abstinent.  Complains of mild burning when passing urine.  No urinary frequency.  No blood in urine.  No fever.  Bowel movements normal.  Breasts normal.  Body mass index 28.13.  Walks more during the summer.  Health labs with family physician.  Cologuard negative per patient in 2019.  Past medical history,surgical history, family history and social history were all reviewed and documented in the EPIC chart.  Gynecologic History No LMP recorded. Patient is postmenopausal. Contraception: post menopausal status Last Pap: 2012. Results were: Negative Last mammogram: 11/2017. Results were: Negative Bone Density: 2015 Osteopenia T-Score -2.4. Colonoscopy: 2013.  Cologard negative 2019 per patient  Obstetric History OB History  Gravida Para Term Preterm AB Living  0            SAB TAB Ectopic Multiple Live Births                ROS: A ROS was performed and pertinent positives and negatives are included in the history.  GENERAL: No fevers or chills. HEENT: No change in vision, no earache, sore throat or sinus congestion. NECK: No pain or stiffness. CARDIOVASCULAR: No chest pain or pressure. No palpitations. PULMONARY: No shortness of breath, cough or wheeze. GASTROINTESTINAL: No abdominal pain, nausea, vomiting or diarrhea, melena or bright red blood per rectum. GENITOURINARY: No urinary frequency, urgency, hesitancy or dysuria. MUSCULOSKELETAL: No joint or muscle pain, no back pain, no recent trauma. DERMATOLOGIC: No rash, no itching, no lesions. ENDOCRINE: No polyuria, polydipsia, no heat or cold intolerance. No recent change in weight. HEMATOLOGICAL: No anemia or easy bruising or bleeding. NEUROLOGIC: No headache, seizures, numbness, tingling or weakness.  PSYCHIATRIC: No depression, no loss of interest in normal activity or change in sleep pattern.     Exam:   BP 120/78   Ht 5\' 1"  (1.549 m)   Wt 148 lb 14.4 oz (67.5 kg)   BMI 28.13 kg/m   Body mass index is 28.13 kg/m.  General appearance : Well developed well nourished female. No acute distress HEENT: Eyes: no retinal hemorrhage or exudates,  Neck supple, trachea midline, no carotid bruits, no thyroidmegaly Lungs: Clear to auscultation, no rhonchi or wheezes, or rib retractions  Heart: Regular rate and rhythm, no murmurs or gallops Breast:Examined in sitting and supine position were symmetrical in appearance, no palpable masses or tenderness,  no skin retraction, no nipple inversion, no nipple discharge, no skin discoloration, no axillary or supraclavicular lymphadenopathy Abdomen: no palpable masses or tenderness, no rebound or guarding Extremities: no edema or skin discoloration or tenderness  Pelvic: Vulva: Normal             Vagina: No gross lesions or discharge  Cervix: No gross lesions or discharge.  Pap reflex done  Uterus  AV, normal size, shape and consistency, non-tender and mobile  Adnexa  Without masses or tenderness  Anus: Normal  U/A: Yellow clear, protein negative, nitrites negative, white blood cells 0-5, red blood cells negative, no bacteria.  Urine culture pending.   Assessment/Plan:  78 y.o. female for annual exam   1. Encounter for routine gynecological examination with Papanicolaou smear of cervix Normal gynecologic exam in menopause.  Pap reflex done.  Breast exam normal.  Last screening mammogram  was negative in March 2019.  Cologuard negative in 2019.  Health labs with family physician.  Body mass index 28.13.  Recommend a mild decrease in calories/carb and nutrition.  Recommend increase physical activities with aerobic activities 5 times a week and light weights every 2 days.  2. Postmenopausal Well on no hormone replacement therapy.  No postmenopausal  bleeding.  3. Osteopenia of multiple sites Osteopenia with a T score of -2.4 in 2015.  Will schedule a bone density here now.  Recommend vitamin D supplements, calcium intake of 1200 mg to 1500 mg daily.  Weightbearing physical activity such as walking on a regular basis. - DG Bone Density; Future  4. Dysuria Urine analysis showing only 0-5 white blood cells.  Will wait on urine culture before treating as it will probably be negative. - Urinalysis,Complete w/RFL Culture  Counseling on above issues and coordination of care more than 50% for 10 minutes.  Princess Bruins MD, 11:58 AM 09/23/2018

## 2018-09-24 LAB — PAP IG W/ RFLX HPV ASCU

## 2018-09-25 LAB — URINALYSIS, COMPLETE W/RFL CULTURE
BACTERIA UA: NONE SEEN /HPF
Bilirubin Urine: NEGATIVE
Glucose, UA: NEGATIVE
HGB URINE DIPSTICK: NEGATIVE
Hyaline Cast: NONE SEEN /LPF
KETONES UR: NEGATIVE
NITRITES URINE, INITIAL: NEGATIVE
PH: 7 (ref 5.0–8.0)
Protein, ur: NEGATIVE
RBC / HPF: NONE SEEN /HPF (ref 0–2)
Specific Gravity, Urine: 1.01 (ref 1.001–1.03)

## 2018-09-25 LAB — URINE CULTURE
MICRO NUMBER:: 52305
RESULT: NO GROWTH
SPECIMEN QUALITY:: ADEQUATE

## 2018-09-25 LAB — CULTURE INDICATED

## 2018-10-17 ENCOUNTER — Ambulatory Visit (INDEPENDENT_AMBULATORY_CARE_PROVIDER_SITE_OTHER): Payer: Medicare HMO

## 2018-10-17 ENCOUNTER — Other Ambulatory Visit: Payer: Self-pay | Admitting: Obstetrics & Gynecology

## 2018-10-17 DIAGNOSIS — M8589 Other specified disorders of bone density and structure, multiple sites: Secondary | ICD-10-CM

## 2018-10-17 DIAGNOSIS — Z780 Asymptomatic menopausal state: Secondary | ICD-10-CM | POA: Diagnosis not present

## 2018-10-17 DIAGNOSIS — M85851 Other specified disorders of bone density and structure, right thigh: Secondary | ICD-10-CM

## 2018-10-17 DIAGNOSIS — M8588 Other specified disorders of bone density and structure, other site: Secondary | ICD-10-CM

## 2018-10-18 ENCOUNTER — Other Ambulatory Visit: Payer: Self-pay | Admitting: Internal Medicine

## 2018-10-18 DIAGNOSIS — Z1231 Encounter for screening mammogram for malignant neoplasm of breast: Secondary | ICD-10-CM

## 2018-10-22 ENCOUNTER — Telehealth: Payer: Self-pay | Admitting: *Deleted

## 2018-10-22 NOTE — Telephone Encounter (Signed)
Patient informed. 

## 2018-10-22 NOTE — Telephone Encounter (Signed)
-----   Message from Princess Bruins, MD sent at 10/18/2018  5:46 PM EST ----- Regarding: BD results Osteopenia.  Repeat BD in 2 yrs.  Vit D, Ca++, weight bearing activities to continue.

## 2018-12-02 ENCOUNTER — Ambulatory Visit: Payer: Medicare HMO

## 2018-12-04 ENCOUNTER — Other Ambulatory Visit: Payer: Self-pay | Admitting: Internal Medicine

## 2018-12-04 DIAGNOSIS — F329 Major depressive disorder, single episode, unspecified: Secondary | ICD-10-CM

## 2018-12-04 DIAGNOSIS — F419 Anxiety disorder, unspecified: Principal | ICD-10-CM

## 2018-12-04 DIAGNOSIS — F32A Depression, unspecified: Secondary | ICD-10-CM

## 2018-12-04 NOTE — Telephone Encounter (Signed)
Last filled 04/18/18... please advise

## 2019-01-02 ENCOUNTER — Ambulatory Visit: Payer: Medicare HMO

## 2019-02-11 DIAGNOSIS — C44622 Squamous cell carcinoma of skin of right upper limb, including shoulder: Secondary | ICD-10-CM | POA: Diagnosis not present

## 2019-02-11 DIAGNOSIS — D485 Neoplasm of uncertain behavior of skin: Secondary | ICD-10-CM | POA: Diagnosis not present

## 2019-02-11 DIAGNOSIS — Z85828 Personal history of other malignant neoplasm of skin: Secondary | ICD-10-CM | POA: Diagnosis not present

## 2019-02-11 DIAGNOSIS — D225 Melanocytic nevi of trunk: Secondary | ICD-10-CM | POA: Diagnosis not present

## 2019-02-11 DIAGNOSIS — C44629 Squamous cell carcinoma of skin of left upper limb, including shoulder: Secondary | ICD-10-CM | POA: Diagnosis not present

## 2019-02-11 DIAGNOSIS — L570 Actinic keratosis: Secondary | ICD-10-CM | POA: Diagnosis not present

## 2019-02-11 DIAGNOSIS — L821 Other seborrheic keratosis: Secondary | ICD-10-CM | POA: Diagnosis not present

## 2019-02-11 DIAGNOSIS — D1801 Hemangioma of skin and subcutaneous tissue: Secondary | ICD-10-CM | POA: Diagnosis not present

## 2019-02-11 DIAGNOSIS — B078 Other viral warts: Secondary | ICD-10-CM | POA: Diagnosis not present

## 2019-02-19 ENCOUNTER — Other Ambulatory Visit: Payer: Self-pay

## 2019-02-19 ENCOUNTER — Ambulatory Visit
Admission: RE | Admit: 2019-02-19 | Discharge: 2019-02-19 | Disposition: A | Discharge status: Home / Self Care | Payer: Medicare HMO | Source: Ambulatory Visit | Attending: Internal Medicine | Admitting: Internal Medicine

## 2019-02-19 DIAGNOSIS — Z1231 Encounter for screening mammogram for malignant neoplasm of breast: Secondary | ICD-10-CM | POA: Diagnosis not present

## 2019-02-26 ENCOUNTER — Other Ambulatory Visit: Payer: Self-pay | Admitting: Internal Medicine

## 2019-02-26 DIAGNOSIS — L570 Actinic keratosis: Secondary | ICD-10-CM | POA: Diagnosis not present

## 2019-02-26 DIAGNOSIS — B078 Other viral warts: Secondary | ICD-10-CM | POA: Diagnosis not present

## 2019-02-26 DIAGNOSIS — I10 Essential (primary) hypertension: Secondary | ICD-10-CM

## 2019-02-26 DIAGNOSIS — F32A Depression, unspecified: Secondary | ICD-10-CM

## 2019-02-26 DIAGNOSIS — F329 Major depressive disorder, single episode, unspecified: Secondary | ICD-10-CM

## 2019-02-26 DIAGNOSIS — F419 Anxiety disorder, unspecified: Secondary | ICD-10-CM

## 2019-02-26 DIAGNOSIS — C44629 Squamous cell carcinoma of skin of left upper limb, including shoulder: Secondary | ICD-10-CM | POA: Diagnosis not present

## 2019-02-27 DIAGNOSIS — C44622 Squamous cell carcinoma of skin of right upper limb, including shoulder: Secondary | ICD-10-CM | POA: Diagnosis not present

## 2019-03-10 DIAGNOSIS — R69 Illness, unspecified: Secondary | ICD-10-CM | POA: Diagnosis not present

## 2019-03-19 DIAGNOSIS — B078 Other viral warts: Secondary | ICD-10-CM | POA: Diagnosis not present

## 2019-03-19 DIAGNOSIS — L905 Scar conditions and fibrosis of skin: Secondary | ICD-10-CM | POA: Diagnosis not present

## 2019-03-19 DIAGNOSIS — L570 Actinic keratosis: Secondary | ICD-10-CM | POA: Diagnosis not present

## 2019-03-19 DIAGNOSIS — Z85828 Personal history of other malignant neoplasm of skin: Secondary | ICD-10-CM | POA: Diagnosis not present

## 2019-04-04 ENCOUNTER — Other Ambulatory Visit: Payer: Self-pay | Admitting: Internal Medicine

## 2019-04-04 DIAGNOSIS — I10 Essential (primary) hypertension: Secondary | ICD-10-CM

## 2019-04-04 DIAGNOSIS — F419 Anxiety disorder, unspecified: Secondary | ICD-10-CM

## 2019-04-04 DIAGNOSIS — F32A Depression, unspecified: Secondary | ICD-10-CM

## 2019-04-04 DIAGNOSIS — F329 Major depressive disorder, single episode, unspecified: Secondary | ICD-10-CM

## 2019-04-22 ENCOUNTER — Other Ambulatory Visit: Payer: Self-pay

## 2019-04-22 ENCOUNTER — Ambulatory Visit (INDEPENDENT_AMBULATORY_CARE_PROVIDER_SITE_OTHER): Payer: Medicare HMO | Admitting: Internal Medicine

## 2019-04-22 ENCOUNTER — Encounter: Payer: Self-pay | Admitting: Internal Medicine

## 2019-04-22 VITALS — BP 102/64 | HR 88 | Temp 97.8°F | Ht 61.0 in | Wt 164.1 lb

## 2019-04-22 DIAGNOSIS — M545 Low back pain, unspecified: Secondary | ICD-10-CM

## 2019-04-22 DIAGNOSIS — I10 Essential (primary) hypertension: Secondary | ICD-10-CM | POA: Diagnosis not present

## 2019-04-22 DIAGNOSIS — F329 Major depressive disorder, single episode, unspecified: Secondary | ICD-10-CM | POA: Diagnosis not present

## 2019-04-22 DIAGNOSIS — F419 Anxiety disorder, unspecified: Secondary | ICD-10-CM

## 2019-04-22 DIAGNOSIS — R69 Illness, unspecified: Secondary | ICD-10-CM | POA: Diagnosis not present

## 2019-04-22 DIAGNOSIS — Z0000 Encounter for general adult medical examination without abnormal findings: Secondary | ICD-10-CM

## 2019-04-22 DIAGNOSIS — R300 Dysuria: Secondary | ICD-10-CM | POA: Diagnosis not present

## 2019-04-22 DIAGNOSIS — E7800 Pure hypercholesterolemia, unspecified: Secondary | ICD-10-CM | POA: Diagnosis not present

## 2019-04-22 DIAGNOSIS — M8589 Other specified disorders of bone density and structure, multiple sites: Secondary | ICD-10-CM

## 2019-04-22 DIAGNOSIS — F32A Depression, unspecified: Secondary | ICD-10-CM

## 2019-04-22 LAB — POCT URINALYSIS DIPSTICK
Bilirubin, UA: NEGATIVE
Blood, UA: NEGATIVE
Glucose, UA: NEGATIVE
Ketones, UA: NEGATIVE
Leukocytes, UA: NEGATIVE
Nitrite, UA: NEGATIVE
Protein, UA: NEGATIVE
Spec Grav, UA: 1.015 (ref 1.010–1.025)
Urobilinogen, UA: 0.2 E.U./dL
pH, UA: 7 (ref 5.0–8.0)

## 2019-04-22 LAB — COMPREHENSIVE METABOLIC PANEL
ALT: 18 U/L (ref 0–35)
AST: 23 U/L (ref 0–37)
Albumin: 4.7 g/dL (ref 3.5–5.2)
Alkaline Phosphatase: 51 U/L (ref 39–117)
BUN: 24 mg/dL — ABNORMAL HIGH (ref 6–23)
CO2: 29 mEq/L (ref 19–32)
Calcium: 10.3 mg/dL (ref 8.4–10.5)
Chloride: 100 mEq/L (ref 96–112)
Creatinine, Ser: 1.04 mg/dL (ref 0.40–1.20)
GFR: 50.92 mL/min — ABNORMAL LOW (ref >60.00)
Glucose, Bld: 101 mg/dL — ABNORMAL HIGH (ref 70–99)
Potassium: 4.1 mEq/L (ref 3.5–5.1)
Sodium: 138 mEq/L (ref 135–145)
Total Bilirubin: 0.6 mg/dL (ref 0.2–1.2)
Total Protein: 7.2 g/dL (ref 6.0–8.3)

## 2019-04-22 LAB — LIPID PANEL
Cholesterol: 234 mg/dL — ABNORMAL HIGH (ref 0–200)
HDL: 71.2 mg/dL (ref >39.00)
LDL Cholesterol: 134 mg/dL — ABNORMAL HIGH (ref 0–99)
NonHDL: 162.39
Total CHOL/HDL Ratio: 3
Triglycerides: 142 mg/dL (ref 0.0–149.0)
VLDL: 28.4 mg/dL (ref 0.0–40.0)

## 2019-04-22 LAB — CBC
HCT: 38 % (ref 36.0–46.0)
Hemoglobin: 12.8 g/dL (ref 12.0–15.0)
MCHC: 33.6 g/dL (ref 30.0–36.0)
MCV: 93.9 fl (ref 78.0–100.0)
Platelets: 201 10*3/uL (ref 150.0–400.0)
RBC: 4.05 Mil/uL (ref 3.87–5.11)
RDW: 12.9 % (ref 11.5–15.5)
WBC: 5 10*3/uL (ref 4.0–10.5)

## 2019-04-22 LAB — VITAMIN D 25 HYDROXY (VIT D DEFICIENCY, FRACTURES): VITD: 76.87 ng/mL (ref 30.00–100.00)

## 2019-04-22 MED ORDER — TRIAMTERENE-HCTZ 37.5-25 MG PO TABS: 1.0000 | ORAL_TABLET | Freq: Every day | ORAL | 0 refills | Status: DC

## 2019-04-22 MED ORDER — CLONAZEPAM 0.5 MG PO TABS: 0.5000 mg | ORAL_TABLET | Freq: Every day | ORAL | 0 refills | Status: DC

## 2019-04-22 MED ORDER — SERTRALINE HCL 100 MG PO TABS: ORAL_TABLET | ORAL | 0 refills | Status: DC

## 2019-04-22 NOTE — Addendum Note (Signed)
Addended by: Jearld Fenton on: 04/22/2019 04:29 PM   Modules accepted: Orders

## 2019-04-22 NOTE — Patient Instructions (Signed)

## 2019-04-22 NOTE — Assessment & Plan Note (Signed)
CMET and Lipid profile today Encouraged her to consume a low fat diet Continue Red Yeast Rice and Fish Oil OTC

## 2019-04-22 NOTE — Assessment & Plan Note (Signed)
Support offered today Continue Sertraline and Clonazepam, refilled today Will get CSA at next vist

## 2019-04-22 NOTE — Assessment & Plan Note (Signed)
Continue Calcium and Vit D Encouraged daily weight bearing exercise

## 2019-04-22 NOTE — Progress Notes (Addendum)
HPI:  Pt presents to the clinic today for her subsequent annual Medicare Wellness Exam. She is also due to follow up chronic conditions.  OA: Mainly in her hands. She takes Advil as needed with good relief.  Anxiety and Depression: Chronic but stable on her current does of Sertraline. She takes Clonazepam as needed for insomnia. She is not currently seeing a therapist. She denies SI/HI.  HTN: Her BP today is 102/64. She is taking Triamterene HCT as prescribed. ECG from 03/2015 reviewed.  HLD: Her last LDL was 145, 04/2018. She is statin intolerant. She takes Red Yeast Rice and Fish Oil OTC. She does not consume a low fat diet.  Osteopenia: Bone density from 10/2018 reviewed. She is taking Calcium and Vit D as prescribed. She tries to get 30 minutes of weight bearing exercise daily.  Past Medical History:  Diagnosis Date  . Allergy   . Arthritis   . Depression   . Hypertension     Current Outpatient Medications  Medication Sig Dispense Refill  . aspirin 81 MG tablet Take 81 mg by mouth daily.      . Calcium Carbonate (CALCIUM 600 PO) Take 2 capsules by mouth daily.    . cholecalciferol (VITAMIN D) 1000 UNITS tablet Take 1,000 Units by mouth daily.    . clonazePAM (KLONOPIN) 0.5 MG tablet TAKE 1 TABLET (0.5 MG TOTAL) BY MOUTH AT BEDTIME. 30 tablet 0  . fish oil-omega-3 fatty acids 1000 MG capsule Take 1,000 mg by mouth daily.      . fluticasone (FLONASE) 50 MCG/ACT nasal spray Place 2 sprays into both nostrils daily. 16 g 6  . Magnesium 500 MG CAPS Take 1 capsule by mouth daily.    . Multiple Vitamin (MULTIVITAMIN) capsule Take 1 capsule by mouth daily.      . Red Yeast Rice Extract (RED YEAST RICE PO) Take 1 capsule by mouth daily.    . sertraline (ZOLOFT) 100 MG tablet TAKE 1.5 TABS BY MOUTH EVERY DAY 135 tablet 0  . triamterene-hydrochlorothiazide (MAXZIDE) 75-50 MG tablet TAKE 1 TABLET BY MOUTH EVERY DAY 90 tablet 0  . vitamin B-12 (CYANOCOBALAMIN) 1000 MCG tablet Take 1,000 mcg by  mouth daily.     No current facility-administered medications for this visit.     Allergies  Allergen Reactions  . Codeine Anxiety    Family History  Problem Relation Age of Onset  . Hypertension Sister   . Hypertension Brother   . Diabetes Brother   . Cancer Mother        CERVICAL  . Heart disease Mother   . Stroke Mother   . Hypertension Mother   . Heart disease Father   . Hypertension Father   . Diabetes Father   . Breast cancer Other     Social History   Socioeconomic History  . Marital status: Widowed    Spouse name: Not on file  . Number of children: Not on file  . Years of education: Not on file  . Highest education level: Not on file  Occupational History  . Not on file  Social Needs  . Financial resource strain: Not on file  . Food insecurity    Worry: Not on file    Inability: Not on file  . Transportation needs    Medical: Not on file    Non-medical: Not on file  Tobacco Use  . Smoking status: Never Smoker  . Smokeless tobacco: Never Used  Substance and Sexual Activity  . Alcohol   use: No  . Drug use: Not on file  . Sexual activity: Never  Lifestyle  . Physical activity    Days per week: Not on file    Minutes per session: Not on file  . Stress: Not on file  Relationships  . Social connections    Talks on phone: Not on file    Gets together: Not on file    Attends religious service: Not on file    Active member of club or organization: Not on file    Attends meetings of clubs or organizations: Not on file    Relationship status: Not on file  . Intimate partner violence    Fear of current or ex partner: Not on file    Emotionally abused: Not on file    Physically abused: Not on file    Forced sexual activity: Not on file  Other Topics Concern  . Not on file  Social History Narrative  . Not on file    Hospitiliaztions: None  Health Maintenance:    Flu: 06/2018  Tetanus: 04/2011  Pneumovax: 04/2008  Prevnar: 03/2015  Zostavax:  04/2011  Shingrix: never  Mammogram: 02/2019  Pap Smear: 09/2018  Bone Density: 10/2018  Colon Screening: Cologuard, 05/2018  Eye Doctor:  Dental Exam:   Providers:   PCP: Regina Baity, NP  Dermatologist: Dr. Lupton  Gynecologist: Dr. Lavoie   I have personally reviewed and have noted:  1. The patient's medical and social history 2. Their use of alcohol, tobacco or illicit drugs 3. Their current medications and supplements 4. The patient's functional ability including ADL's, fall risks, home safety risks and hearing or visual impairment. 5. Diet and physical activities 6. Evidence for depression or mood disorder  Subjective:   Review of Systems:   Constitutional: Denies fever, malaise, fatigue, headache or abrupt weight changes.  HEENT: Denies eye pain, eye redness, ear pain, ringing in the ears, wax buildup, runny nose, nasal congestion, bloody nose, or sore throat. Respiratory: Denies difficulty breathing, shortness of breath, cough or sputum production.   Cardiovascular: Denies chest pain, chest tightness, palpitations or swelling in the hands or feet.  Gastrointestinal: Denies abdominal pain, bloating, constipation, diarrhea or blood in the stool.  GU: Denies urgency, frequency, pain with urination, burning sensation, blood in urine, odor or discharge. Musculoskeletal: Pt reports intermittent joint pain in hands, low back pain. Denies decrease in range of motion, difficulty with gait, muscle pain or joint swelling.  Skin: Denies redness, rashes, lesions or ulcercations.  Neurological: Denies dizziness, difficulty with memory, difficulty with speech or problems with balance and coordination.  Psych: Pt has a history of anxiety and depression. Denies SI/HI.  No other specific complaints in a complete review of systems (except as listed in HPI above).  Objective:  PE:   BP 102/64 (BP Location: Right Arm, Patient Position: Sitting, Cuff Size: Normal)   Pulse 88   Temp 97.8  F (36.6 C) (Temporal)   Ht 5' 1" (1.549 m)   Wt 164 lb 1.9 oz (74.4 kg)   SpO2 93%   BMI 31.01 kg/m   Wt Readings from Last 3 Encounters:  09/23/18 148 lb 14.4 oz (67.5 kg)  08/20/18 164 lb (74.4 kg)  07/09/18 163 lb (73.9 kg)    General: Appears ther stated age, well developed, well nourished in NAD. Skin: Warm, dry and intact. No rashesnoted. HEENT: Head: normal shape and size; Eyes: sclera white, no icterus, conjunctiva pink, PERRLA and EOMs intact; Ears: Tm's gray and   intact, normal light reflex;  Neck: Neck supple, trachea midline. No masses, lumps or thyromegaly present.  Cardiovascular: Normal rate and rhythm. S1,S2 noted.  No murmur, rubs or gallops noted. No JVD or BLE edema. No carotid bruits noted. Pulmonary/Chest: Normal effort and positive vesicular breath sounds. No respiratory distress. No wheezes, rales or ronchi noted.  Abdomen: Soft and nontender. Normal bowel sounds. No distention or masses noted. Liver, spleen and kidneys non palpable. Musculoskeletal:  Strength 5/5 BUE/BLE. No signs of joint swelling.  Neurological: Alert and oriented. Cranial nerves II-XII grossly intact. Coordination normal.  Psychiatric: Mood and affect normal. Behavior is normal. Judgment and thought content normal.     BMET    Component Value Date/Time   NA 139 04/18/2018 0845   K 4.9 04/18/2018 0845   CL 101 04/18/2018 0845   CO2 31 04/18/2018 0845   GLUCOSE 107 (H) 04/18/2018 0845   BUN 31 (H) 04/18/2018 0845   CREATININE 1.09 04/18/2018 0845   CALCIUM 10.4 04/18/2018 0845   GFRNONAA >60 02/14/2009 0450   GFRAA  02/14/2009 0450    >60        The eGFR has been calculated using the MDRD equation. This calculation has not been validated in all clinical situations. eGFR's persistently <60 mL/min signify possible Chronic Kidney Disease.    Lipid Panel     Component Value Date/Time   CHOL 243 (H) 04/18/2018 0845   TRIG 115.0 04/18/2018 0845   HDL 75.80 04/18/2018 0845    CHOLHDL 3 04/18/2018 0845   VLDL 23.0 04/18/2018 0845   LDLCALC 145 (H) 04/18/2018 0845    CBC    Component Value Date/Time   WBC 4.6 04/18/2018 0845   RBC 4.19 04/18/2018 0845   HGB 13.1 04/18/2018 0845   HCT 38.7 04/18/2018 0845   PLT 242.0 04/18/2018 0845   MCV 92.4 04/18/2018 0845   MCHC 33.9 04/18/2018 0845   RDW 13.3 04/18/2018 0845    Hgb A1C Lab Results  Component Value Date   HGBA1C 5.7 09/29/2013      Assessment and Plan:   Medicare Annual Wellness Visit:  Diet: She does eat meat. She consumes fruits and veggies daily. She does eat some fried foods. She drinks mostly coffee, water. Physical activity: Some walking Depression/mood screen: Chronic but stable on meds. PHQ 9 score of 0 Hearing: Intact to whispered voice Visual acuity: Grossly normal, performs annual eye exam  ADLs: Capable Fall risk: None Home safety: Good Cognitive evaluation: Intact to orientation, naming, recall and repetition EOL planning: Adv directives, full code/ I agree  Preventative Medicine: Encouraged her to get a flu shot in the fall. Tetanus, pneumovax, prevnar and zostovax UTD. She declines Shingrix. Pap smear, mammogram, bone density and colon screening UTD. Encouraged her to consume a balanced diet and exercise regimen. Advised her to see an eye doctor and dentist annually. Will check CBC, CMET, Lipid and Vit D today. Due dates of screening exams given to pt as part of her AVS.  Acute Low Back Pain:  Likely muscular Urinalysis per pt request- normal Will send urine culture Encouraged stretching and NSAID's OTC   Next appointment: 1 year, Medicare Wellness Exam   Webb Silversmith, NP

## 2019-04-22 NOTE — Assessment & Plan Note (Signed)
Low Repeat and verified 104/78 CMET today Will have her check her BP daily and call back with readings in 1 week Continue Triamterene HCTZ for now but consider cutting dose once she calls back with readings

## 2019-04-24 LAB — URINE CULTURE
MICRO NUMBER:: 758956
Result:: NO GROWTH
SPECIMEN QUALITY:: ADEQUATE

## 2019-05-01 DIAGNOSIS — H5203 Hypermetropia, bilateral: Secondary | ICD-10-CM | POA: Diagnosis not present

## 2019-05-01 DIAGNOSIS — H25013 Cortical age-related cataract, bilateral: Secondary | ICD-10-CM | POA: Diagnosis not present

## 2019-05-01 DIAGNOSIS — H52223 Regular astigmatism, bilateral: Secondary | ICD-10-CM | POA: Diagnosis not present

## 2019-05-01 DIAGNOSIS — H2513 Age-related nuclear cataract, bilateral: Secondary | ICD-10-CM | POA: Diagnosis not present

## 2019-05-01 DIAGNOSIS — H524 Presbyopia: Secondary | ICD-10-CM | POA: Diagnosis not present

## 2019-05-12 ENCOUNTER — Encounter: Payer: Self-pay | Admitting: Internal Medicine

## 2019-05-12 ENCOUNTER — Other Ambulatory Visit: Payer: Self-pay

## 2019-05-12 ENCOUNTER — Ambulatory Visit (INDEPENDENT_AMBULATORY_CARE_PROVIDER_SITE_OTHER): Payer: Medicare HMO | Admitting: Internal Medicine

## 2019-05-12 VITALS — BP 130/76 | HR 76 | Temp 98.1°F | Wt 164.0 lb

## 2019-05-12 DIAGNOSIS — R944 Abnormal results of kidney function studies: Secondary | ICD-10-CM | POA: Diagnosis not present

## 2019-05-12 DIAGNOSIS — Z0100 Encounter for examination of eyes and vision without abnormal findings: Secondary | ICD-10-CM | POA: Diagnosis not present

## 2019-05-12 DIAGNOSIS — I10 Essential (primary) hypertension: Secondary | ICD-10-CM | POA: Diagnosis not present

## 2019-05-12 DIAGNOSIS — R7989 Other specified abnormal findings of blood chemistry: Secondary | ICD-10-CM | POA: Diagnosis not present

## 2019-05-12 LAB — BASIC METABOLIC PANEL
BUN: 24 mg/dL — ABNORMAL HIGH (ref 6–23)
CO2: 30 mEq/L (ref 19–32)
Calcium: 9.8 mg/dL (ref 8.4–10.5)
Chloride: 102 mEq/L (ref 96–112)
Creatinine, Ser: 1.04 mg/dL (ref 0.40–1.20)
GFR: 50.91 mL/min — ABNORMAL LOW (ref >60.00)
Glucose, Bld: 92 mg/dL (ref 70–99)
Potassium: 4.4 mEq/L (ref 3.5–5.1)
Sodium: 139 mEq/L (ref 135–145)

## 2019-05-12 NOTE — Progress Notes (Signed)
Subjective:    Patient ID: Monica Fuller, female    DOB: 1941/05/15, 78 y.o.   MRN: [id number]  HPI  Patient presents to the clinic today for 2-week follow-up of hypertension.  At her last visit, her GFR came back at 50.92.  Her Triamterene HCTZ was cut in half to 37.5-25 mg daily.  She has been taking the medication as prescribed and denies adverse side effects.  She denies frequent headaches, dizziness, visual changes, chest pain or swelling in her lower extremities.  She has intermittent shortness of breath that has not gotten worse.  She has been monitoring her blood pressures at home and they are averaging 130/70s.  ECG from 03/31/2015 reviewed.  Review of Systems      Past Medical History:  Diagnosis Date  . Allergy   . Arthritis   . Depression   . Hypertension     Current Outpatient Medications  Medication Sig Dispense Refill  . aspirin 81 MG tablet Take 81 mg by mouth daily.      . Calcium Carbonate (CALCIUM 600 PO) Take 2 capsules by mouth daily.    . cholecalciferol (VITAMIN D) 1000 UNITS tablet Take 1,000 Units by mouth daily.    . clonazePAM (KLONOPIN) 0.5 MG tablet Take 1 tablet (0.5 mg total) by mouth at bedtime. 30 tablet 0  . fish oil-omega-3 fatty acids 1000 MG capsule Take 1,000 mg by mouth daily.      . fluticasone (FLONASE) 50 MCG/ACT nasal spray Place 2 sprays into both nostrils daily. 16 g 6  . Magnesium 500 MG CAPS Take 1 capsule by mouth daily.    . Multiple Vitamin (MULTIVITAMIN) capsule Take 1 capsule by mouth daily.      . Red Yeast Rice Extract (RED YEAST RICE PO) Take 1 capsule by mouth daily.    . sertraline (ZOLOFT) 100 MG tablet TAKE 1.5 TABS BY MOUTH EVERY DAY 135 tablet 0  . triamterene-hydrochlorothiazide (MAXZIDE-25) 37.5-25 MG tablet Take 1 tablet by mouth daily. 30 tablet 0  . vitamin B-12 (CYANOCOBALAMIN) 1000 MCG tablet Take 1,000 mcg by mouth daily.     No current facility-administered medications for this visit.     Allergies   Allergen Reactions  . Statins   . Codeine Anxiety    Family History  Problem Relation Age of Onset  . Hypertension Sister   . Hypertension Brother   . Diabetes Brother   . Cancer Mother        CERVICAL  . Heart disease Mother   . Stroke Mother   . Hypertension Mother   . Heart disease Father   . Hypertension Father   . Diabetes Father   . Breast cancer Other     Social History   Socioeconomic History  . Marital status: Widowed    Spouse name: Not on file  . Number of children: Not on file  . Years of education: Not on file  . Highest education level: Not on file  Occupational History  . Not on file  Social Needs  . Financial resource strain: Not on file  . Food insecurity    Worry: Not on file    Inability: Not on file  . Transportation needs    Medical: Not on file    Non-medical: Not on file  Tobacco Use  . Smoking status: Never Smoker  . Smokeless tobacco: Never Used  Substance and Sexual Activity  . Alcohol use: No  . Drug use: Not on file  .  Sexual activity: Never  Lifestyle  . Physical activity    Days per week: Not on file    Minutes per session: Not on file  . Stress: Not on file  Relationships  . Social Herbalist on phone: Not on file    Gets together: Not on file    Attends religious service: Not on file    Active member of club or organization: Not on file    Attends meetings of clubs or organizations: Not on file    Relationship status: Not on file  . Intimate partner violence    Fear of current or ex partner: Not on file    Emotionally abused: Not on file    Physically abused: Not on file    Forced sexual activity: Not on file  Other Topics Concern  . Not on file  Social History Narrative  . Not on file     Constitutional: Denies fever, malaise, fatigue, headache or abrupt weight changes.  Respiratory: Pt reports intermittent shortness of breath. Denies difficulty breathing, cough or sputum production.   Cardiovascular:  Denies chest pain, chest tightness, palpitations or swelling in the hands or feet.    No other specific complaints in a complete review of systems (except as listed in HPI above).  Objective:   Physical Exam  BP 130/76   Pulse 76   Temp 98.1 F (36.7 C) (Temporal)   Wt 164 lb (74.4 kg)   BMI 30.99 kg/m  Wt Readings from Last 3 Encounters:  05/12/19 164 lb (74.4 kg)  04/22/19 164 lb 1.9 oz (74.4 kg)  09/23/18 148 lb 14.4 oz (67.5 kg)    General: Appears her stated age, well developed, well nourished in NAD. Cardiovascular: Normal rate and rhythm. S1,S2 noted.  No murmur, rubs or gallops noted. No JVD or BLE edema.  Pulmonary/Chest: Normal effort and positive vesicular breath sounds. No respiratory distress. No wheezes, rales or ronchi noted.  Neurological: Alert and oriented.     BMET    Component Value Date/Time   NA 138 04/22/2019 0832   K 4.1 04/22/2019 0832   CL 100 04/22/2019 0832   CO2 29 04/22/2019 0832   GLUCOSE 101 (H) 04/22/2019 0832   BUN 24 (H) 04/22/2019 0832   CREATININE 1.04 04/22/2019 0832   CALCIUM 10.3 04/22/2019 0832   GFRNONAA >60 02/14/2009 0450   GFRAA  02/14/2009 0450    >60        The eGFR has been calculated using the MDRD equation. This calculation has not been validated in all clinical situations. eGFR's persistently <60 mL/min signify possible Chronic Kidney Disease.    Lipid Panel     Component Value Date/Time   CHOL 234 (H) 04/22/2019 0832   TRIG 142.0 04/22/2019 0832   HDL 71.20 04/22/2019 0832   CHOLHDL 3 04/22/2019 0832   VLDL 28.4 04/22/2019 0832   LDLCALC 134 (H) 04/22/2019 0832    CBC    Component Value Date/Time   WBC 5.0 04/22/2019 0832   RBC 4.05 04/22/2019 0832   HGB 12.8 04/22/2019 0832   HCT 38.0 04/22/2019 0832   PLT 201.0 04/22/2019 0832   MCV 93.9 04/22/2019 0832   MCHC 33.6 04/22/2019 0832   RDW 12.9 04/22/2019 0832    Hgb A1C Lab Results  Component Value Date   HGBA1C 5.7 09/29/2013             Assessment & Plan:

## 2019-05-12 NOTE — Patient Instructions (Signed)

## 2019-05-12 NOTE — Assessment & Plan Note (Signed)
Controlled on lower dose of Triamterene HCT BMET today If GFR normal, will continue with your current dose of Triamterene HCT If GFR abnormal, will stop Triamterene HCT and switch to Benazepril 10 mg We will follow-up after labs are back, return precautions discussed.

## 2019-05-14 ENCOUNTER — Telehealth: Payer: Self-pay

## 2019-05-14 MED ORDER — BENAZEPRIL HCL 10 MG PO TABS: 10.0000 mg | ORAL_TABLET | Freq: Every day | ORAL | 0 refills | Status: DC

## 2019-05-14 NOTE — Addendum Note (Signed)
Addended by: Lurlean Nanny on: 05/14/2019 05:05 PM   Modules accepted: Orders

## 2019-05-14 NOTE — Telephone Encounter (Signed)
Pt left v/m requesting lab results.

## 2019-05-15 DIAGNOSIS — R69 Illness, unspecified: Secondary | ICD-10-CM | POA: Diagnosis not present

## 2019-05-17 ENCOUNTER — Other Ambulatory Visit: Payer: Self-pay | Admitting: Internal Medicine

## 2019-05-18 ENCOUNTER — Other Ambulatory Visit: Payer: Self-pay

## 2019-05-18 ENCOUNTER — Encounter (HOSPITAL_COMMUNITY): Payer: Self-pay

## 2019-05-18 ENCOUNTER — Ambulatory Visit (HOSPITAL_COMMUNITY)
Admission: EM | Admit: 2019-05-18 | Discharge: 2019-05-18 | Disposition: A | Discharge status: Home / Self Care | Payer: Medicare HMO | Attending: Family Medicine | Admitting: Family Medicine

## 2019-05-18 DIAGNOSIS — I10 Essential (primary) hypertension: Secondary | ICD-10-CM | POA: Insufficient documentation

## 2019-05-18 LAB — BASIC METABOLIC PANEL
Anion gap: 8 (ref 5–15)
BUN: 20 mg/dL (ref 8–23)
CO2: 26 mmol/L (ref 22–32)
Calcium: 9.3 mg/dL (ref 8.9–10.3)
Chloride: 106 mmol/L (ref 98–111)
Creatinine, Ser: 0.8 mg/dL (ref 0.44–1.00)
GFR calc Af Amer: >60 mL/min (ref >60)
GFR calc non Af Amer: >60 mL/min (ref >60)
Glucose, Bld: 87 mg/dL (ref 70–99)
Potassium: 3.8 mmol/L (ref 3.5–5.1)
Sodium: 140 mmol/L (ref 135–145)

## 2019-05-18 LAB — POCT URINALYSIS DIP (DEVICE)
Bilirubin Urine: NEGATIVE
Glucose, UA: NEGATIVE mg/dL
Ketones, ur: NEGATIVE mg/dL
Nitrite: NEGATIVE
Protein, ur: NEGATIVE mg/dL
Specific Gravity, Urine: 1.015 (ref 1.005–1.030)
Urobilinogen, UA: 0.2 mg/dL (ref 0.0–1.0)
pH: 6.5 (ref 5.0–8.0)

## 2019-05-18 NOTE — Discharge Instructions (Signed)
Your urine did not show any signs of infection Please hold on taking further benazepril at this time We are rechecking your kidney function today Please monitor your blood pressure in the morning, afternoon and evening if your blood pressure gets above 170/110 you may take the lower dose of the triamterene/HCTZ combo medicine Please follow-up with primary care this week  Please follow-up here in the meantime if you cannot get in with your primary care and having any worsening or changing symptoms

## 2019-05-18 NOTE — ED Triage Notes (Signed)
Pt states she has had her B/P med changed and she has started to gain weight.  Pt states she thinks she swollen from the weight gain. 4 lbs since Thursday. ( She has a new B/P med. )

## 2019-05-18 NOTE — ED Provider Notes (Signed)
[place]    CSN: [id number] Arrival date & time: 05/18/19  1035      History   Chief Complaint Chief Complaint  Patient presents with  . Hypertension    HPI Monica Fuller is a 78 y.o. female history of hypertension, arthritis, osteopenia, presenting today for concern over blood pressure change and weight gain.  Patient states that on Thursday she was switched from triamterene/HCTZ combo to benazepril.  This switch was meant in order to protect kidneys.  Since she has felt she has gained weight and has noted a 4 pound increase since the medicine switch.  She feels as if her stomach is more swollen.  She also notes that she has had decreased urine output.  She denies dysuria or hematuria.  Denies chest pain.  Has occasional shortness of breath, but this is not new since medicine change.  HPI  Past Medical History:  Diagnosis Date  . Allergy   . Arthritis   . Depression   . Hypertension     Patient Active Problem List   Diagnosis Date Noted  . HLD (hyperlipidemia) 09/29/2013  . Osteopenia 01/13/2013  . Anxiety and depression   . Hypertension     Past Surgical History:  Procedure Laterality Date  . 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 Authorizing Provider  aspirin 81 MG tablet Take 81 mg by mouth daily.      [provider]  benazepril (LOTENSIN) 10 MG tablet Take 1 tablet (10 mg total) by mouth daily. 05/14/19   Jearld Fenton, NP  Calcium Carbonate (CALCIUM 600 PO) Take 2 capsules by mouth daily.    [provider]  cholecalciferol (VITAMIN D) 1000 UNITS tablet Take 1,000 Units by mouth daily.    [provider]  clonazePAM (KLONOPIN) 0.5 MG tablet  Take 1 tablet (0.5 mg total) by mouth at bedtime. 04/22/19   Jearld Fenton, NP  fish oil-omega-3 fatty acids 1000 MG capsule Take 1,000 mg by mouth daily.      [provider]  fluticasone (FLONASE) 50 MCG/ACT nasal spray Place 2 sprays into both nostrils daily. 08/20/18   Jearld Fenton, NP  Magnesium 500 MG CAPS Take 1 capsule by mouth daily.    [provider]  Multiple Vitamin (MULTIVITAMIN) capsule Take 1 capsule by mouth daily.      [provider]  Red Yeast Rice Extract (RED YEAST RICE PO) Take 1 capsule by mouth daily.    [provider]  sertraline (ZOLOFT) 100 MG tablet TAKE 1.5 TABS BY MOUTH EVERY DAY 04/22/19   Jearld Fenton, NP  triamterene-hydrochlorothiazide (MAXZIDE-25) 37.5-25 MG tablet Take 1 tablet by mouth daily. 04/22/19   Jearld Fenton, NP  vitamin B-12 (CYANOCOBALAMIN) 1000 MCG tablet Take 1,000 mcg by mouth daily.    [provider]    Family History Family History  Problem Relation Age of Onset  . Hypertension Sister   . Hypertension Brother   . Diabetes Brother   . Cancer Mother        CERVICAL  . Heart disease Mother   . Stroke Mother   . Hypertension Mother   . Heart disease Father   . Hypertension Father   . Diabetes Father   . Breast cancer Other     Social History Social History   Tobacco Use  . Smoking status: Never Smoker  . Smokeless tobacco: Never Used  Substance Use Topics  . Alcohol use: No  . Drug use: Not on file     Allergies   Statins and Codeine   Review of Systems Review of Systems  Constitutional: Positive for unexpected weight change. Negative for fatigue and fever.  HENT: Negative for congestion, sinus pressure and sore throat.   Eyes: Negative for photophobia, pain and visual disturbance.  Respiratory: Negative for cough and shortness of breath.   Cardiovascular: Negative for chest pain.  Gastrointestinal: Negative for abdominal pain, nausea and vomiting.   Genitourinary: Negative for decreased urine volume and hematuria.  Musculoskeletal: Negative for myalgias, neck pain and neck stiffness.  Neurological: Negative for dizziness, syncope, facial asymmetry, speech difficulty, weakness, light-headedness, numbness and headaches.     Physical Exam Triage Vital Signs ED Triage Vitals  Enc Vitals Group     BP 05/18/19 1055 (!) 155/65     Pulse Rate 05/18/19 1055 64     Resp 05/18/19 1055 18     Temp 05/18/19 1055 97.9 F (36.6 C)     Temp Source 05/18/19 1055 Oral     SpO2 05/18/19 1055 97 %     Weight 05/18/19 1054 170 lb (77.1 kg)     Height --      Head Circumference --      Peak Flow --      Pain Score 05/18/19 1053 0     Pain Loc --      Pain Edu? --      Excl. in [place]? --    No data found.  Updated Vital Signs BP (!) 155/65 (BP Location: Right Arm)   Pulse 64   Temp 97.9 F (36.6 C) (Oral)   Resp 18   Wt 170 lb (77.1 kg)   SpO2 97%   BMI 32.12 kg/m   Visual Acuity Right Eye Distance:   Left Eye Distance:   Bilateral Distance:    Right Eye Near:   Left Eye Near:    Bilateral Near:     Physical Exam Vitals signs and nursing note reviewed.  Constitutional:      General: She is not in acute distress.    Appearance: She is well-developed.  HENT:     Head: Normocephalic and atraumatic.  Eyes:     Conjunctiva/sclera: Conjunctivae normal.  Neck:     Musculoskeletal: Neck supple.  Cardiovascular:     Rate and Rhythm: Normal rate and regular rhythm.     Heart sounds: No murmur.  Pulmonary:     Effort: Pulmonary effort is normal. No respiratory distress.     Breath sounds: Normal breath sounds.     Comments: Breathing comfortably at rest, CTABL, no wheezing, rales or other adventitious sounds auscultated Abdominal:     Palpations: Abdomen is soft.     Tenderness: There is no abdominal tenderness.     Comments: Soft, nondistended  Musculoskeletal:     Comments: No palpable edema in bilateral  lower extremities   Skin:    General: Skin is warm and dry.  Neurological:     General: No focal deficit present.     Mental Status: She is alert and oriented to person, place, and time. Mental status is at baseline.     Cranial Nerves: No cranial nerve deficit.      UC Treatments / Results  Labs (all labs ordered are listed, but only abnormal results are displayed) Labs Reviewed  POCT URINALYSIS DIP (DEVICE) - Abnormal; Notable for the following components:      Result Value   Hgb urine dipstick TRACE (*)    Leukocytes,Ua TRACE (*)    All other components within normal limits  BASIC METABOLIC PANEL    EKG   Radiology No results found.  Procedures Procedures (including critical care time)  Medications Ordered in UC Medications - No data to display  Initial Impression / Assessment and Plan / UC Course  I have reviewed the triage vital signs and the nursing notes.  Pertinent labs & imaging results that were available during my care of the patient were reviewed by me and considered in my medical decision making (see chart for details).    Discussed case with Dr. Raeford Razor. Trace leuks on UA, not suggestive of infection at this time.  Will recheck creatinine/kidney function on BMP today.  Will have hold benazepril temporarily and recommending follow-up with PCP this week for further evaluation of this.  Advising to monitor blood pressure in the meantime, advised she may take a dose of her lower dose of triamterene/HCTZ combo as long as kidney function stable.  Follow-up here in the interim if having any other issues.  Discussed strict return precautions. Patient verbalized understanding and is agreeable with plan.  Final Clinical Impressions(s) / UC Diagnoses   Final diagnoses:  Essential hypertension     Discharge Instructions     Your urine did not show any signs of infection Please hold on taking further benazepril at this time We are rechecking your kidney function today Please  monitor your blood pressure in the morning, afternoon and evening if your blood pressure gets above 170/110 you may take the lower dose of the triamterene/HCTZ combo medicine Please follow-up with primary care this week  Please follow-up here in the meantime if you cannot get in with your primary care and having any worsening or changing symptoms   ED Prescriptions    None     Controlled Substance Prescriptions [place] Controlled Substance Registry consulted? Not Applicable   Janith Lima, [place] 05/18/19 1151

## 2019-05-20 ENCOUNTER — Telehealth: Payer: Self-pay

## 2019-05-20 ENCOUNTER — Encounter: Payer: Self-pay | Admitting: Family Medicine

## 2019-05-20 ENCOUNTER — Ambulatory Visit (INDEPENDENT_AMBULATORY_CARE_PROVIDER_SITE_OTHER): Payer: Medicare HMO | Admitting: Family Medicine

## 2019-05-20 ENCOUNTER — Other Ambulatory Visit: Payer: Self-pay

## 2019-05-20 DIAGNOSIS — I10 Essential (primary) hypertension: Secondary | ICD-10-CM | POA: Diagnosis not present

## 2019-05-20 MED ORDER — BENAZEPRIL-HYDROCHLOROTHIAZIDE 10-12.5 MG PO TABS: 1.0000 | ORAL_TABLET | Freq: Every day | ORAL | 3 refills | Status: DC

## 2019-05-20 NOTE — Patient Instructions (Addendum)
Stop the medication  Benazepril and Maxide.  Start combo benazepril/HCTZ 10/25 mg daily.  Cancel labs  And cahnge to follow up in 2 weeks with Webb Silversmith for HTN and increased fluid.   Call if swelling  and weight gain is not improving with new BP regimen.  Goal BP < 140/90.

## 2019-05-20 NOTE — Progress Notes (Signed)
Chief Complaint  Patient presents with  . Hypertension    Seen in [facility] 05/18/2019  . Wheezing  . Foot Swelling    History of Present Illness: HPI   78 year old female patient of  Rollene Fare Baity's with history of HTN presetns for BP check.   At wellness with PCP on 04/22/2019  BP was 102/64 so her maxide was decreased in strength.  On 05/12/2019 she saw her PCP..  BP was 130/76 GFR  At 50.. so changed  maxide to benazapril 10 mg.   She was seen at an urgent care on 05/18/2019 for  4 lb Weight gain and BP increase.   Occ SOB, stomach more swollen. BP was 155/65 Told to hold benazapril and restarted maxied lower dose if BP was > 160. No SE to benazepril like oral swelling or cough, throat clearing.  Now she has increasing SOB, wheezing, increasing swelling in ankles and continued BP elevation.  no chest apin.  BP Readings from Last 3 Encounters:  05/20/19 (!) 158/82  05/18/19 (!) 155/65  05/12/19 130/76   Wt Readings from Last 3 Encounters:  05/20/19 172 lb 8 oz (78.2 kg)  05/18/19 170 lb (77.1 kg)  05/12/19 164 lb (74.4 kg)     She is not taking benazapril or maxide now.  COVID 19 screen No recent travel or known exposure to COVID19 The patient denies respiratory symptoms of COVID 19 at this time.  The importance of social distancing was discussed today.   Review of Systems  Constitutional: Negative for chills and fever.  HENT: Negative for congestion and ear pain.   Eyes: Negative for pain and redness.  Respiratory: Negative for cough and sputum production.   Cardiovascular: Negative for chest pain, palpitations and leg swelling.  Gastrointestinal: Negative for abdominal pain, blood in stool, constipation, diarrhea, nausea and vomiting.  Genitourinary: Negative for dysuria.  Musculoskeletal: Negative for falls and myalgias.  Skin: Negative for rash.  Neurological: Negative for dizziness.  Psychiatric/Behavioral: Negative for depression. The patient is not nervous/anxious.        Past Medical History:  Diagnosis Date  . Allergy   . Arthritis   . Depression   . Hypertension     reports that she has never smoked. She has never used smokeless tobacco. She reports that she does not drink alcohol.   Current Outpatient Medications:  .  aspirin 81 MG tablet, Take 81 mg by mouth daily.  , Disp: , Rfl:  .  Calcium Carbonate (CALCIUM 600 PO), Take 2 capsules by mouth daily., Disp: , Rfl:  .  cholecalciferol (VITAMIN D) 1000 UNITS tablet, Take 1,000 Units by mouth daily., Disp: , Rfl:  .  clonazePAM (KLONOPIN) 0.5 MG tablet, Take 1 tablet (0.5 mg total) by mouth at bedtime., Disp: 30 tablet, Rfl: 0 .  fish oil-omega-3 fatty acids 1000 MG capsule, Take 1,000 mg by mouth daily.  , Disp: , Rfl:  .  fluticasone (FLONASE) 50 MCG/ACT nasal spray, Place 2 sprays into both nostrils daily., Disp: 16 g, Rfl: 6 .  Magnesium 500 MG CAPS, Take 1 capsule by mouth daily., Disp: , Rfl:  .  Multiple Vitamin (MULTIVITAMIN) capsule, Take 1 capsule by mouth daily.  , Disp: , Rfl:  .  Red Yeast Rice Extract (RED YEAST RICE PO), Take 1 capsule by mouth daily., Disp: , Rfl:  .  sertraline (ZOLOFT) 100 MG tablet, TAKE 1.5 TABS BY MOUTH EVERY DAY, Disp: 135 tablet, Rfl: 0 .  vitamin B-12 (  CYANOCOBALAMIN) 1000 MCG tablet, Take 1,000 mcg by mouth daily., Disp: , Rfl:  .  benazepril (LOTENSIN) 10 MG tablet, Take 1 tablet (10 mg total) by mouth daily. (Patient not taking: Reported on 05/20/2019), Disp: 30 tablet, Rfl: 0 .  triamterene-hydrochlorothiazide (MAXZIDE-25) 37.5-25 MG tablet, Take 1 tablet by mouth daily. (Patient not taking: Reported on 05/20/2019), Disp: 30 tablet, Rfl: 0   Observations/Objective: Blood pressure (!) 158/82, pulse 82, temperature 98.5 F (36.9 C), temperature source Temporal, height 5\' 1"  (1.549 m), weight 172 lb 8 oz (78.2 kg), SpO2 98 %.  Physical Exam Constitutional:      General: She is not in acute distress.    Appearance: Normal appearance. She is  well-developed. She is obese. She is not ill-appearing or toxic-appearing.  HENT:     Head: Normocephalic.     Right Ear: Hearing, tympanic membrane, ear canal and external ear normal. Tympanic membrane is not erythematous, retracted or bulging.     Left Ear: Hearing, tympanic membrane, ear canal and external ear normal. Tympanic membrane is not erythematous, retracted or bulging.     Nose: No mucosal edema or rhinorrhea.     Right Sinus: No maxillary sinus tenderness or frontal sinus tenderness.     Left Sinus: No maxillary sinus tenderness or frontal sinus tenderness.     Mouth/Throat:     Pharynx: Uvula midline.  Eyes:     General: Lids are normal. Lids are everted, no foreign bodies appreciated.     Conjunctiva/sclera: Conjunctivae normal.     Pupils: Pupils are equal, round, and reactive to light.  Neck:     Musculoskeletal: Normal range of motion and neck supple.     Thyroid: No thyroid mass or thyromegaly.     Vascular: No carotid bruit.     Trachea: Trachea normal.  Cardiovascular:     Rate and Rhythm: Normal rate and regular rhythm.     Pulses: Normal pulses.     Heart sounds: Normal heart sounds, S1 normal and S2 normal. No murmur. No friction rub. No gallop.   Pulmonary:     Effort: Pulmonary effort is normal. No tachypnea or respiratory distress.     Breath sounds: Normal breath sounds. No decreased breath sounds, wheezing, rhonchi or rales.  Abdominal:     General: Bowel sounds are normal.     Palpations: Abdomen is soft.     Tenderness: There is no abdominal tenderness.  Musculoskeletal:     Right lower leg: Pitting Edema present.     Left lower leg: 1+ Pitting Edema present.  Skin:    General: Skin is warm and dry.     Findings: No rash.  Neurological:     Mental Status: She is alert.  Psychiatric:        Mood and Affect: Mood is not anxious or depressed.        Speech: Speech normal.        Behavior: Behavior normal. Behavior is cooperative.        Thought  Content: Thought content normal.        Judgment: Judgment normal.      Assessment and Plan   Hypertension Inadequate control on no medication.  Currently fluid overloaded although no pulmonary edema noted on exam.  GFR now in nml range off all diuretics... will have to tolerate slight decrease in GFR to keep her fluid status in control.   Restart benazepril in combination with HCTZ 12.5 mg daily.  Follow BP at home,  oif fluid status not improving  Towards end of week.. pt to call for further med adjustment.     Eliezer Lofts, MD

## 2019-05-20 NOTE — Telephone Encounter (Signed)
[place] Night - Client TELEPHONE ADVICE RECORD [facility] Patient Name: Monica Fuller Gender: Female DOB: 05/05/1941 Age: 78 Y 98 M 29 D Return Phone Number: [phone] (Primary) Address: City/State/Zip: [place] [address] Client [facility] Night - Client Client Site [place] Physician Webb Silversmith - NP Contact Type Call Who Is Calling Patient / Member / Family / Caregiver Call Type Triage / Clinical Relationship To Patient Self Return Phone Number [phone] (Primary) Chief Complaint Medication reaction Reason for Call Symptomatic / Request for [place] states that she was changed to different blood pressure medication, states that she is retaining fluid and she is unsure if she should still take the medication or that the medication is causing her to swell. Translation No Nurse Assessment Nurse: Loreen Freud, RN, [place] Date/Time (Eastern Time): 05/18/2019 9:43:35 AM Confirm and document reason for call. If symptomatic, describe symptoms. ---Caller states that she was changed to different blood pressure medication (benazepril), changed on Thursday, states that she is retaining fluid and she is unsure if she should still take the medication or that the medication is causing her to swell, she doesn't look swollen but she has gained about 4 lbs. Previous medication was Triamterene. Has the patient had close contact with a person known or suspected to have the novel coronavirus illness OR traveled / lives in area with major community spread (including international travel) in the last 14 days from the onset of symptoms? * If Asymptomatic, screen for exposure and travel within the last 14 days. ---No Does the patient have any new or worsening symptoms? ---Yes Will a triage be completed? ---Yes Related visit to physician within the last 2 weeks? ---Yes Does the PT  have any chronic conditions? (i.e. diabetes, asthma, this includes High risk factors for pregnancy, etc.) ---Yes List chronic conditions. ---Hypertension, depression Is this a behavioral health or substance abuse call? ---No PLEASE NOTE: All timestamps contained within this report are represented as [place] Standard Time. CONFIDENTIALTY NOTICE: This fax transmission is intended only for the addressee. It contains information that is legally privileged, confidential or otherwise protected from use or disclosure. If you are not the intended recipient, you are strictly prohibited from reviewing, disclosing, copying using or disseminating any of this information or taking any action in reliance on or regarding this information. If you have received this fax in error, please notify us immediately by telephone so that we can arrange for its return to [place]. Phone: [phone], Toll-Free: [phone], Fax: [phone] Page: 2 of 2 Call Id: [id number] Guidelines Guideline Title Affirmed Question Affirmed Notes Nurse Date/Time Eilene Ghazi Time) Hand Swelling [1] MILD swelling (puffiness) of both hands AND [2] new onset or worsening (Exception: caused by hot weather or normal pregnancy swelling) [place], RN, Crystal 05/18/2019 9:54:07 AM Disp. Time Eilene Ghazi Time) Disposition Final User 05/18/2019 10:01:17 AM See PCP within 24 Hours Yes [place], RN, Interior and spatial designer Understands Yes PreDisposition Did not know what to do Care Advice Given Per Guideline SEE PCP WITHIN 24 HOURS: * IF OFFICE WILL BE CLOSED AND NO PCP (PRIMARY CARE PROVIDER) SECONDLEVEL TRIAGE: You need to be seen within the next 24 hours. A clinic or an urgent care center is often a good source of care if your doctor's office is closed or you can't get an appointment. CALL BACK IF: * Swelling becomes worse * Swelling becomes red or painful to the touch * You become worse. CARE  ADVICE given per Hand Swelling (Adult)  guideline. Comments User: Willette Brace, RN Date/Time Eilene Ghazi Time): 05/18/2019 10:02:13 AM Caller denies any heart failure. Referrals [facility] [place]

## 2019-05-20 NOTE — Assessment & Plan Note (Signed)
Inadequate control on no medication.  Currently fluid overloaded although no pulmonary edema noted on exam.  GFR now in nml range off all diuretics... will have to tolerate slight decrease in GFR to keep her fluid status in control.   Restart benazepril in combination with HCTZ 12.5 mg daily.  Follow BP at home, oif fluid status not improving  Towards end of week.. pt to call for further med adjustment.

## 2019-05-20 NOTE — Telephone Encounter (Signed)
Per chart review tab pt seen 09/06/20[facility] and pt already has appt today at 9:20 with Dr Diona Browner.

## 2019-05-22 ENCOUNTER — Telehealth (HOSPITAL_COMMUNITY): Payer: Self-pay | Admitting: Emergency Medicine

## 2019-05-22 NOTE — Telephone Encounter (Signed)
Attempted to reach patient about blood work. No answer at this time.

## 2019-05-23 ENCOUNTER — Telehealth: Payer: Self-pay

## 2019-05-23 ENCOUNTER — Encounter: Payer: Self-pay | Admitting: Family Medicine

## 2019-05-23 ENCOUNTER — Ambulatory Visit (INDEPENDENT_AMBULATORY_CARE_PROVIDER_SITE_OTHER): Payer: Medicare HMO | Admitting: Family Medicine

## 2019-05-23 ENCOUNTER — Other Ambulatory Visit: Payer: Self-pay

## 2019-05-23 DIAGNOSIS — I10 Essential (primary) hypertension: Secondary | ICD-10-CM

## 2019-05-23 MED ORDER — TRIAMTERENE-HCTZ 37.5-25 MG PO TABS: 0.5000 | ORAL_TABLET | Freq: Every day | ORAL | 3 refills | Status: DC | PRN

## 2019-05-23 NOTE — Progress Notes (Signed)
She is still on benazepril/hctz 10/12.5mg  daily.  Was recently taking off diuretic.  She had inc weight and edema and took one dose of triamterene/HCTZ 37.5/12.5 yesterday with change in systolic from [id number] down to 150s.  Lost 3 lbs overnight.  BP is better today.  No FNAVD. Still with mild BLE edema.  Some L shoulder ache yesterday for about 5 minutes, when at rest.  It resolved, no return of sx.   No exertional pain.  She isn't lightheaded.  Her breathing feels better with dec in edema.    Meds, vitals, and allergies reviewed.   ROS: Per HPI unless specifically indicated in ROS section   GEN: nad, alert and oriented HEENT: mucous membranes moist NECK: supple w/o LA CV: rrr PULM: ctab, no inc wob ABD: soft, +bs EXT: trace BLE edema SKIN: no acute rash

## 2019-05-23 NOTE — Telephone Encounter (Signed)
Pt concerned about BP being elevated.  pt was seen [facility] on 05/18/19; pt saw Dr Diona Browner on 05/20/19; on 09/10/20at 5 pm BP was 176/88, pt had slight pain in lt shoulder for 5-10 mins and pt took triamterene HCTZ 37.5-25 mg (this was med that was previously stopped); 05/22/19 9 PM BP 156/83. On 05/23/19 7:15 am BP 157/77 P 64; pt lost 3 lbs of fluid last night. No H/A,dizziness,CP or SOB; sorry for lateness of getting note in but due to emergent triage calls caused delay. Pt had appt to see Dr Damita Dunnings today at 9:30 and pt was to bring all meds. FYI to Dr Damita Dunnings.

## 2019-05-23 NOTE — Telephone Encounter (Signed)
See OV note.  

## 2019-05-23 NOTE — Patient Instructions (Addendum)
Keep taking benazepril/hctz 10/12.5- take 1 pill a day.   In the meantime, if your have swelling or weight gain then take 1/2 tab of triamterene/HCTZ 37.5/12.5 per day as needed.  If no swelling then don't take it that day.    If you are needing to take 1/2 tab of triamterene/HCTZ 37.5/12.5 everyday without relief, then okay to take 1 whole tab for one day then resume 1/2 tab daily as needed.   Update [place] next week about weight/BP and usual amount of triamterene/HCTZ 37.5/12.5 needed per day.   Take care.  Glad to see you.

## 2019-05-25 NOTE — Assessment & Plan Note (Signed)
Discussed options with patient.  Would keep taking benazepril/hctz 10/12.5- take 1 pill a day.   In the meantime, if swelling or weight gain then take 1/2 tab of triamterene/HCTZ 37.5/12.5 per day as needed.  If no swelling then don't take it that day.    If she is needing to take 1/2 tab of triamterene/HCTZ 37.5/12.5 everyday without relief, then okay to take 1 whole tab for one day then resume 1/2 tab daily as needed.   She can update [place] next week about weight/BP and usual amount of triamterene/HCTZ 37.5/12.5 needed per day.   She agrees with plan.  Routed to PCP as FYI.

## 2019-05-27 NOTE — Telephone Encounter (Signed)
Pt had visit on 05/20/19 and 05/23/19.

## 2019-05-29 ENCOUNTER — Other Ambulatory Visit: Payer: Medicare HMO

## 2019-06-03 ENCOUNTER — Encounter: Payer: Self-pay | Admitting: Internal Medicine

## 2019-06-03 ENCOUNTER — Other Ambulatory Visit: Payer: Self-pay

## 2019-06-03 ENCOUNTER — Ambulatory Visit (INDEPENDENT_AMBULATORY_CARE_PROVIDER_SITE_OTHER): Payer: Medicare HMO | Admitting: Internal Medicine

## 2019-06-03 DIAGNOSIS — I10 Essential (primary) hypertension: Secondary | ICD-10-CM | POA: Diagnosis not present

## 2019-06-03 MED ORDER — BENAZEPRIL-HYDROCHLOROTHIAZIDE 20-25 MG PO TABS: 1.0000 | ORAL_TABLET | Freq: Every day | ORAL | 0 refills | Status: DC

## 2019-06-03 NOTE — Progress Notes (Signed)
Subjective:    Patient ID: Monica Fuller, female    DOB: 12/17/1940, 78 y.o.   MRN: [id number]  HPI  Pt presents to the clinic today for 2 week follow up of HTN. Seen 8/31, Triamterene-HCT d/c'd due to decreased GFR. Start on Benazepril 10 mg daily. Went to the ER 9/6 for HTN and weight gain, advised to stop Benazepril and restart Triamterene HCT. Saw Dr. Diona Browner 9/8 for follow up. Triamterene HCT d/c'd again, switched to Benazepril HCT. She is taking the medication as prescribed. Her BP today is 142/80.   Review of Systems      Past Medical History:  Diagnosis Date  . Allergy   . Arthritis   . Depression   . Hypertension     Current Outpatient Medications  Medication Sig Dispense Refill  . aspirin 81 MG tablet Take 81 mg by mouth daily.      . benazepril-hydrochlorthiazide (LOTENSIN HCT) 10-12.5 MG tablet Take 1 tablet by mouth daily. 30 tablet 3  . Calcium Carbonate (CALCIUM 600 PO) Take 2 capsules by mouth daily.    . cholecalciferol (VITAMIN D) 1000 UNITS tablet Take 1,000 Units by mouth daily.    . clonazePAM (KLONOPIN) 0.5 MG tablet Take 1 tablet (0.5 mg total) by mouth at bedtime. 30 tablet 0  . fish oil-omega-3 fatty acids 1000 MG capsule Take 1,000 mg by mouth daily.      . fluticasone (FLONASE) 50 MCG/ACT nasal spray Place 2 sprays into both nostrils daily. 16 g 6  . Magnesium 500 MG CAPS Take 1 capsule by mouth daily.    . Multiple Vitamin (MULTIVITAMIN) capsule Take 1 capsule by mouth daily.      . Red Yeast Rice Extract (RED YEAST RICE PO) Take 1 capsule by mouth daily.    . sertraline (ZOLOFT) 100 MG tablet TAKE 1.5 TABS BY MOUTH EVERY DAY 135 tablet 0  . triamterene-hydrochlorothiazide (MAXZIDE-25) 37.5-25 MG tablet Take 0.5-1 tablets by mouth daily as needed. 90 tablet 3  . vitamin B-12 (CYANOCOBALAMIN) 1000 MCG tablet Take 1,000 mcg by mouth daily.     No current facility-administered medications for this visit.     Allergies  Allergen Reactions  .  Statins   . Codeine Anxiety    Family History  Problem Relation Age of Onset  . Hypertension Sister   . Hypertension Brother   . Diabetes Brother   . Cancer Mother        CERVICAL  . Heart disease Mother   . Stroke Mother   . Hypertension Mother   . Heart disease Father   . Hypertension Father   . Diabetes Father   . Breast cancer Other     Social History   Socioeconomic History  . Marital status: Widowed    Spouse name: Not on file  . Number of children: Not on file  . Years of education: Not on file  . Highest education level: Not on file  Occupational History  . Not on file  Social Needs  . Financial resource strain: Not on file  . Food insecurity    Worry: Not on file    Inability: Not on file  . Transportation needs    Medical: Not on file    Non-medical: Not on file  Tobacco Use  . Smoking status: Never Smoker  . Smokeless tobacco: Never Used  Substance and Sexual Activity  . Alcohol use: No  . Drug use: Not on file  . Sexual activity: Never  Lifestyle  . Physical activity    Days per week: Not on file    Minutes per session: Not on file  . Stress: Not on file  Relationships  . Social Herbalist on phone: Not on file    Gets together: Not on file    Attends religious service: Not on file    Active member of club or organization: Not on file    Attends meetings of clubs or organizations: Not on file    Relationship status: Not on file  . Intimate partner violence    Fear of current or ex partner: Not on file    Emotionally abused: Not on file    Physically abused: Not on file    Forced sexual activity: Not on file  Other Topics Concern  . Not on file  Social History Narrative  . Not on file     Constitutional: Denies fever, malaise, fatigue, headache or abrupt weight changes.  Respiratory: Denies difficulty breathing, shortness of breath, cough or sputum production.   Cardiovascular: Denies chest pain, chest tightness,  palpitations or swelling in the hands or feet.  Neurological: Denies dizziness, difficulty with memory, difficulty with speech or problems with balance and coordination.   No other specific complaints in a complete review of systems (except as listed in HPI above).  Objective:   Physical Exam   BP (!) 142/80   Pulse 65   Temp (!) 97.2 F (36.2 C) (Temporal)   Wt 168 lb (76.2 kg)   SpO2 98%   BMI 31.74 kg/m  Wt Readings from Last 3 Encounters:  06/03/19 168 lb (76.2 kg)  05/23/19 166 lb 5 oz (75.4 kg)  05/20/19 172 lb 8 oz (78.2 kg)    General: Appears her stated age, well developed, well nourished in NAD. Cardiovascular: Normal rate and rhythm. S1,S2 noted.  No murmur, rubs or gallops noted. No JVD or BLE edema.  Pulmonary/Chest: Normal effort and positive vesicular breath sounds. No respiratory distress. No wheezes, rales or ronchi noted.   Neurological: Alert and oriented.    BMET    Component Value Date/Time   NA 140 05/18/2019 1236   K 3.8 05/18/2019 1236   CL 106 05/18/2019 1236   CO2 26 05/18/2019 1236   GLUCOSE 87 05/18/2019 1236   BUN 20 05/18/2019 1236   CREATININE 0.80 05/18/2019 1236   CALCIUM 9.3 05/18/2019 1236   GFRNONAA >60 05/18/2019 1236   GFRAA >60 05/18/2019 1236    Lipid Panel     Component Value Date/Time   CHOL 234 (H) 04/22/2019 0832   TRIG 142.0 04/22/2019 0832   HDL 71.20 04/22/2019 0832   CHOLHDL 3 04/22/2019 0832   VLDL 28.4 04/22/2019 0832   LDLCALC 134 (H) 04/22/2019 0832    CBC    Component Value Date/Time   WBC 5.0 04/22/2019 0832   RBC 4.05 04/22/2019 0832   HGB 12.8 04/22/2019 0832   HCT 38.0 04/22/2019 0832   PLT 201.0 04/22/2019 0832   MCV 93.9 04/22/2019 0832   MCHC 33.6 04/22/2019 0832   RDW 12.9 04/22/2019 0832    Hgb A1C Lab Results  Component Value Date   HGBA1C 5.7 09/29/2013           Assessment & Plan:

## 2019-06-03 NOTE — Patient Instructions (Signed)

## 2019-06-03 NOTE — Assessment & Plan Note (Signed)
Not well controlled Increase Benazepril HCT to 20-25 mg Reinforced DASH diet and exercise for weight loss

## 2019-06-06 ENCOUNTER — Other Ambulatory Visit: Payer: Self-pay | Admitting: Internal Medicine

## 2019-06-12 ENCOUNTER — Other Ambulatory Visit: Payer: Self-pay

## 2019-06-12 ENCOUNTER — Ambulatory Visit (INDEPENDENT_AMBULATORY_CARE_PROVIDER_SITE_OTHER): Payer: Medicare HMO | Admitting: Internal Medicine

## 2019-06-12 VITALS — BP 122/74 | HR 71 | Temp 97.8°F | Wt 163.0 lb

## 2019-06-12 DIAGNOSIS — I10 Essential (primary) hypertension: Secondary | ICD-10-CM | POA: Diagnosis not present

## 2019-06-12 LAB — BASIC METABOLIC PANEL
BUN: 18 mg/dL (ref 6–23)
CO2: 29 mEq/L (ref 19–32)
Calcium: 10 mg/dL (ref 8.4–10.5)
Chloride: 99 mEq/L (ref 96–112)
Creatinine, Ser: 0.94 mg/dL (ref 0.40–1.20)
GFR: 57.2 mL/min — ABNORMAL LOW (ref >60.00)
Glucose, Bld: 105 mg/dL — ABNORMAL HIGH (ref 70–99)
Potassium: 4.3 mEq/L (ref 3.5–5.1)
Sodium: 137 mEq/L (ref 135–145)

## 2019-06-12 MED ORDER — BENAZEPRIL-HYDROCHLOROTHIAZIDE 20-25 MG PO TABS: 1.0000 | ORAL_TABLET | Freq: Every day | ORAL | 1 refills | Status: DC

## 2019-06-12 NOTE — Progress Notes (Signed)
Subjective:    Patient ID: Monica Fuller, female    DOB: [birth year], 78 y.o.   MRN: [id number]  HPI  Patient presents to the clinic today for follow-up of HTN.  We increased her benazepril 10-12.5 mg to 2 tabs daily.  She has been taking the medication as prescribed.  She denies headaches, visual changes, chest pain or shortness of breath.  She reports her edema has improved and her weight has gone back to baseline.  She denies muscle cramping.  Her BP today is 122/74  Review of Systems      Past Medical History:  Diagnosis Date  . Allergy   . Arthritis   . Depression   . Hypertension     Current Outpatient Medications  Medication Sig Dispense Refill  . aspirin 81 MG tablet Take 81 mg by mouth daily.      . benazepril-hydrochlorthiazide (LOTENSIN HCT) 20-25 MG tablet Take 1 tablet by mouth daily. 90 tablet 1  . Calcium Carbonate (CALCIUM 600 PO) Take 2 capsules by mouth daily.    . cholecalciferol (VITAMIN D) 1000 UNITS tablet Take 1,000 Units by mouth daily.    . clonazePAM (KLONOPIN) 0.5 MG tablet Take 1 tablet (0.5 mg total) by mouth at bedtime. 30 tablet 0  . fish oil-omega-3 fatty acids 1000 MG capsule Take 1,000 mg by mouth daily.      . fluticasone (FLONASE) 50 MCG/ACT nasal spray Place 2 sprays into both nostrils daily. 16 g 6  . Magnesium 500 MG CAPS Take 1 capsule by mouth daily.    . Multiple Vitamin (MULTIVITAMIN) capsule Take 1 capsule by mouth daily.      . Red Yeast Rice Extract (RED YEAST RICE PO) Take 1 capsule by mouth daily.    . sertraline (ZOLOFT) 100 MG tablet TAKE 1.5 TABS BY MOUTH EVERY DAY 135 tablet 0  . vitamin B-12 (CYANOCOBALAMIN) 1000 MCG tablet Take 1,000 mcg by mouth daily.     No current facility-administered medications for this visit.     Allergies  Allergen Reactions  . Statins   . Codeine Anxiety    Family History  Problem Relation Age of Onset  . Hypertension Sister   . Hypertension Brother   . Diabetes Brother   . Cancer  Mother        CERVICAL  . Heart disease Mother   . Stroke Mother   . Hypertension Mother   . Heart disease Father   . Hypertension Father   . Diabetes Father   . Breast cancer Other     Social History   Socioeconomic History  . Marital status: Widowed    Spouse name: Not on file  . Number of children: Not on file  . Years of education: Not on file  . Highest education level: Not on file  Occupational History  . Not on file  Social Needs  . Financial resource strain: Not on file  . Food insecurity    Worry: Not on file    Inability: Not on file  . Transportation needs    Medical: Not on file    Non-medical: Not on file  Tobacco Use  . Smoking status: Never Smoker  . Smokeless tobacco: Never Used  Substance and Sexual Activity  . Alcohol use: No  . Drug use: Not on file  . Sexual activity: Never  Lifestyle  . Physical activity    Days per week: Not on file    Minutes per session: Not on  file  . Stress: Not on file  Relationships  . Social Herbalist on phone: Not on file    Gets together: Not on file    Attends religious service: Not on file    Active member of club or organization: Not on file    Attends meetings of clubs or organizations: Not on file    Relationship status: Not on file  . Intimate partner violence    Fear of current or ex partner: Not on file    Emotionally abused: Not on file    Physically abused: Not on file    Forced sexual activity: Not on file  Other Topics Concern  . Not on file  Social History Narrative  . Not on file     Constitutional: Denies fever, malaise, fatigue, headache or abrupt weight changes.  Respiratory: Denies difficulty breathing, shortness of breath, cough or sputum production.   Cardiovascular: Denies chest pain, chest tightness, palpitations or swelling in the hands or feet.  Neurological: Denies dizziness, difficulty with memory, difficulty with speech or problems with balance and coordination.     No other specific complaints in a complete review of systems (except as listed in HPI above).  Objective:   Physical Exam    BP 122/74   Pulse 71   Temp 97.8 F (36.6 C) (Temporal)   Wt 163 lb (73.9 kg)   SpO2 98%   BMI 30.80 kg/m  Wt Readings from Last 3 Encounters:  06/12/19 163 lb (73.9 kg)  06/03/19 168 lb (76.2 kg)  05/23/19 166 lb 5 oz (75.4 kg)    General: Appears her stated age, well developed, well nourished in NAD. Cardiovascular: Normal rate and rhythm. S1,S2 noted.  No murmur, rubs or gallops noted. No JVD or BLE edema. Pulmonary/Chest: Normal effort and positive vesicular breath sounds. No respiratory distress. No wheezes, rales or ronchi noted.  Neurological: Alert and oriented.    BMET    Component Value Date/Time   NA 137 06/12/2019 0911   K 4.3 06/12/2019 0911   CL 99 06/12/2019 0911   CO2 29 06/12/2019 0911   GLUCOSE 105 (H) 06/12/2019 0911   BUN 18 06/12/2019 0911   CREATININE 0.94 06/12/2019 0911   CALCIUM 10.0 06/12/2019 0911   GFRNONAA >60 05/18/2019 1236   GFRAA >60 05/18/2019 1236    Lipid Panel     Component Value Date/Time   CHOL 234 (H) 04/22/2019 0832   TRIG 142.0 04/22/2019 0832   HDL 71.20 04/22/2019 0832   CHOLHDL 3 04/22/2019 0832   VLDL 28.4 04/22/2019 0832   LDLCALC 134 (H) 04/22/2019 0832    CBC    Component Value Date/Time   WBC 5.0 04/22/2019 0832   RBC 4.05 04/22/2019 0832   HGB 12.8 04/22/2019 0832   HCT 38.0 04/22/2019 0832   PLT 201.0 04/22/2019 0832   MCV 93.9 04/22/2019 0832   MCHC 33.6 04/22/2019 0832   RDW 12.9 04/22/2019 0832    Hgb A1C Lab Results  Component Value Date   HGBA1C 5.7 09/29/2013          Assessment & Plan:

## 2019-06-14 ENCOUNTER — Encounter: Payer: Self-pay | Admitting: Internal Medicine

## 2019-06-14 NOTE — Assessment & Plan Note (Signed)
Controlled on current dose of Benazepril HCT B met today Reinforced DASH diet We will monitor

## 2019-06-14 NOTE — Patient Instructions (Signed)

## 2019-06-18 ENCOUNTER — Telehealth: Payer: Self-pay

## 2019-06-18 NOTE — Telephone Encounter (Signed)
[facility] Night - Client Nonclinical Telephone Record [facility] Client [facility] Night - Client Client Site [place] Physician Webb Silversmith - NP Contact Type Call Who Is Calling Patient / Member / Family / Caregiver Caller Name [place] Phone Number [phone] Patient Name Monica Fuller Patient DOB 1941-04-18 Call Type Message Only Information Provided Reason for Call Request for Lab/Test Results Initial Comment Caller states she would like a call back regarding her lab results. Additional Comment Call Closed By: [place] [facility] Transaction Date/Time: 06/18/2019 7:39:53 AM (ET)

## 2019-06-18 NOTE — Telephone Encounter (Signed)
Pt left v/m requesting cb for recent lab results.

## 2019-08-12 DIAGNOSIS — L570 Actinic keratosis: Secondary | ICD-10-CM | POA: Diagnosis not present

## 2019-08-12 DIAGNOSIS — D485 Neoplasm of uncertain behavior of skin: Secondary | ICD-10-CM | POA: Diagnosis not present

## 2019-08-12 DIAGNOSIS — C44329 Squamous cell carcinoma of skin of other parts of face: Secondary | ICD-10-CM | POA: Diagnosis not present

## 2019-08-12 DIAGNOSIS — B078 Other viral warts: Secondary | ICD-10-CM | POA: Diagnosis not present

## 2019-08-29 ENCOUNTER — Other Ambulatory Visit: Payer: Self-pay | Admitting: Family Medicine

## 2019-08-30 ENCOUNTER — Other Ambulatory Visit: Payer: Self-pay | Admitting: Internal Medicine

## 2019-08-30 DIAGNOSIS — F329 Major depressive disorder, single episode, unspecified: Secondary | ICD-10-CM

## 2019-08-30 DIAGNOSIS — F32A Depression, unspecified: Secondary | ICD-10-CM

## 2019-08-30 DIAGNOSIS — F419 Anxiety disorder, unspecified: Secondary | ICD-10-CM

## 2019-09-08 ENCOUNTER — Telehealth: Payer: Self-pay | Admitting: Internal Medicine

## 2019-09-08 NOTE — Telephone Encounter (Signed)
Patient called today She is needing a refill on her most recent Blood Pressure medication. Patient stated she tried to call the pharmacy but they could not figure out which one she is needing refilled and would need her to call our office to have it sent

## 2019-09-09 MED ORDER — BENAZEPRIL-HYDROCHLOROTHIAZIDE 20-25 MG PO TABS: 1.0000 | ORAL_TABLET | Freq: Every day | ORAL | 1 refills | Status: AC

## 2019-09-09 NOTE — Telephone Encounter (Signed)
Rx sent through e-scribe with note to pharmacy with correct dose and to d/c 10/12.5 dose

## 2019-09-09 NOTE — Telephone Encounter (Signed)
Pt left v/m requesting cb to verify what last BP med pt is supposed to be taking.in v/m pt said BP med had been changed 4-5 times and pt wants to make sure what BP med she is supposed to be taking. Pt is almost out of med. On 06/03/19 note increase Benazepril HCTZ to 20-25 mg but benazepril HCTZ 10 - 12.5 (last refilled on 08/29/19) and 20-25 mg is on pts med list.Please advise.

## 2019-09-15 DIAGNOSIS — R69 Illness, unspecified: Secondary | ICD-10-CM | POA: Diagnosis not present

## 2019-09-18 DIAGNOSIS — C44329 Squamous cell carcinoma of skin of other parts of face: Secondary | ICD-10-CM | POA: Diagnosis not present

## 2019-11-03 DIAGNOSIS — B078 Other viral warts: Secondary | ICD-10-CM | POA: Diagnosis not present

## 2019-11-03 DIAGNOSIS — L570 Actinic keratosis: Secondary | ICD-10-CM | POA: Diagnosis not present

## 2019-11-03 DIAGNOSIS — L578 Other skin changes due to chronic exposure to nonionizing radiation: Secondary | ICD-10-CM | POA: Diagnosis not present

## 2019-12-01 DIAGNOSIS — B078 Other viral warts: Secondary | ICD-10-CM | POA: Diagnosis not present

## 2019-12-01 DIAGNOSIS — L570 Actinic keratosis: Secondary | ICD-10-CM | POA: Diagnosis not present

## 2019-12-01 DIAGNOSIS — D485 Neoplasm of uncertain behavior of skin: Secondary | ICD-10-CM | POA: Diagnosis not present

## 2019-12-12 DIAGNOSIS — L249 Irritant contact dermatitis, unspecified cause: Secondary | ICD-10-CM | POA: Diagnosis not present

## 2020-01-12 ENCOUNTER — Other Ambulatory Visit: Payer: Self-pay | Admitting: Internal Medicine

## 2020-01-12 DIAGNOSIS — Z1231 Encounter for screening mammogram for malignant neoplasm of breast: Secondary | ICD-10-CM

## 2020-02-10 ENCOUNTER — Other Ambulatory Visit: Payer: Self-pay | Admitting: Internal Medicine

## 2020-02-10 DIAGNOSIS — F32A Depression, unspecified: Secondary | ICD-10-CM

## 2020-02-10 NOTE — Telephone Encounter (Signed)
Last filled 04/22/2019... please advise

## 2020-02-23 ENCOUNTER — Other Ambulatory Visit: Payer: Self-pay

## 2020-02-23 ENCOUNTER — Ambulatory Visit
Admission: RE | Admit: 2020-02-23 | Discharge: 2020-02-23 | Disposition: A | Discharge status: Home / Self Care | Payer: Medicare HMO | Source: Ambulatory Visit | Attending: Internal Medicine | Admitting: Internal Medicine

## 2020-02-23 DIAGNOSIS — R6889 Other general symptoms and signs: Secondary | ICD-10-CM | POA: Diagnosis not present

## 2020-02-23 DIAGNOSIS — F331 Major depressive disorder, recurrent, moderate: Secondary | ICD-10-CM | POA: Diagnosis not present

## 2020-02-23 DIAGNOSIS — Z136 Encounter for screening for cardiovascular disorders: Secondary | ICD-10-CM | POA: Diagnosis not present

## 2020-02-23 DIAGNOSIS — Z1231 Encounter for screening mammogram for malignant neoplasm of breast: Secondary | ICD-10-CM | POA: Diagnosis not present

## 2020-02-23 DIAGNOSIS — Z1159 Encounter for screening for other viral diseases: Secondary | ICD-10-CM | POA: Diagnosis not present

## 2020-02-23 DIAGNOSIS — Z0000 Encounter for general adult medical examination without abnormal findings: Secondary | ICD-10-CM | POA: Diagnosis not present

## 2020-02-23 DIAGNOSIS — I10 Essential (primary) hypertension: Secondary | ICD-10-CM | POA: Diagnosis not present

## 2020-02-23 DIAGNOSIS — Z7189 Other specified counseling: Secondary | ICD-10-CM | POA: Diagnosis not present

## 2020-02-27 DIAGNOSIS — K625 Hemorrhage of anus and rectum: Secondary | ICD-10-CM | POA: Diagnosis not present

## 2020-03-01 DIAGNOSIS — K625 Hemorrhage of anus and rectum: Secondary | ICD-10-CM | POA: Diagnosis not present

## 2020-03-01 DIAGNOSIS — Z86010 Personal history of colonic polyps: Secondary | ICD-10-CM | POA: Diagnosis not present

## 2020-03-02 DIAGNOSIS — Z1159 Encounter for screening for other viral diseases: Secondary | ICD-10-CM | POA: Diagnosis not present

## 2020-03-05 DIAGNOSIS — K5730 Diverticulosis of large intestine without perforation or abscess without bleeding: Secondary | ICD-10-CM | POA: Diagnosis not present

## 2020-03-05 DIAGNOSIS — K625 Hemorrhage of anus and rectum: Secondary | ICD-10-CM | POA: Diagnosis not present

## 2020-03-05 DIAGNOSIS — Z86010 Personal history of colonic polyps: Secondary | ICD-10-CM | POA: Diagnosis not present

## 2020-03-05 DIAGNOSIS — K648 Other hemorrhoids: Secondary | ICD-10-CM | POA: Diagnosis not present

## 2020-04-26 ENCOUNTER — Encounter: Payer: Medicare HMO | Admitting: Internal Medicine

## 2020-04-26 DIAGNOSIS — F331 Major depressive disorder, recurrent, moderate: Secondary | ICD-10-CM | POA: Diagnosis not present

## 2020-04-26 DIAGNOSIS — I10 Essential (primary) hypertension: Secondary | ICD-10-CM | POA: Diagnosis not present

## 2020-04-26 DIAGNOSIS — F419 Anxiety disorder, unspecified: Secondary | ICD-10-CM | POA: Diagnosis not present

## 2020-07-26 DIAGNOSIS — E559 Vitamin D deficiency, unspecified: Secondary | ICD-10-CM | POA: Diagnosis not present

## 2020-07-26 DIAGNOSIS — D649 Anemia, unspecified: Secondary | ICD-10-CM | POA: Diagnosis not present

## 2020-07-26 DIAGNOSIS — R634 Abnormal weight loss: Secondary | ICD-10-CM | POA: Diagnosis not present

## 2020-07-26 DIAGNOSIS — F419 Anxiety disorder, unspecified: Secondary | ICD-10-CM | POA: Diagnosis not present

## 2020-07-26 DIAGNOSIS — I10 Essential (primary) hypertension: Secondary | ICD-10-CM | POA: Diagnosis not present

## 2020-07-26 DIAGNOSIS — F331 Major depressive disorder, recurrent, moderate: Secondary | ICD-10-CM | POA: Diagnosis not present

## 2020-09-06 DIAGNOSIS — R944 Abnormal results of kidney function studies: Secondary | ICD-10-CM | POA: Diagnosis not present

## 2020-09-29 DIAGNOSIS — Z0100 Encounter for examination of eyes and vision without abnormal findings: Secondary | ICD-10-CM | POA: Diagnosis not present

## 2020-09-29 DIAGNOSIS — H5203 Hypermetropia, bilateral: Secondary | ICD-10-CM | POA: Diagnosis not present

## 2020-09-29 DIAGNOSIS — H25013 Cortical age-related cataract, bilateral: Secondary | ICD-10-CM | POA: Diagnosis not present

## 2020-09-29 DIAGNOSIS — H524 Presbyopia: Secondary | ICD-10-CM | POA: Diagnosis not present

## 2020-09-29 DIAGNOSIS — H2513 Age-related nuclear cataract, bilateral: Secondary | ICD-10-CM | POA: Diagnosis not present

## 2020-09-29 DIAGNOSIS — H52223 Regular astigmatism, bilateral: Secondary | ICD-10-CM | POA: Diagnosis not present

## 2020-11-08 DIAGNOSIS — Z23 Encounter for immunization: Secondary | ICD-10-CM | POA: Diagnosis not present

## 2020-11-08 DIAGNOSIS — M159 Polyosteoarthritis, unspecified: Secondary | ICD-10-CM | POA: Diagnosis not present

## 2020-11-08 DIAGNOSIS — I10 Essential (primary) hypertension: Secondary | ICD-10-CM | POA: Diagnosis not present

## 2020-11-08 DIAGNOSIS — R69 Illness, unspecified: Secondary | ICD-10-CM | POA: Diagnosis not present

## 2021-01-11 ENCOUNTER — Other Ambulatory Visit: Payer: Self-pay | Admitting: Family Medicine

## 2021-01-11 DIAGNOSIS — Z1231 Encounter for screening mammogram for malignant neoplasm of breast: Secondary | ICD-10-CM

## 2021-02-24 ENCOUNTER — Ambulatory Visit
Admission: RE | Admit: 2021-02-24 | Discharge: 2021-02-24 | Disposition: A | Discharge status: Home / Self Care | Payer: Medicare HMO | Source: Ambulatory Visit | Attending: Family Medicine | Admitting: Family Medicine

## 2021-02-24 ENCOUNTER — Ambulatory Visit: Payer: Medicare HMO

## 2021-02-24 ENCOUNTER — Other Ambulatory Visit: Payer: Self-pay

## 2021-02-24 DIAGNOSIS — Z1231 Encounter for screening mammogram for malignant neoplasm of breast: Secondary | ICD-10-CM

## 2021-03-01 DIAGNOSIS — L814 Other melanin hyperpigmentation: Secondary | ICD-10-CM | POA: Diagnosis not present

## 2021-03-01 DIAGNOSIS — L570 Actinic keratosis: Secondary | ICD-10-CM | POA: Diagnosis not present

## 2021-03-01 DIAGNOSIS — Z85828 Personal history of other malignant neoplasm of skin: Secondary | ICD-10-CM | POA: Diagnosis not present

## 2021-03-01 DIAGNOSIS — B078 Other viral warts: Secondary | ICD-10-CM | POA: Diagnosis not present

## 2021-03-01 DIAGNOSIS — L820 Inflamed seborrheic keratosis: Secondary | ICD-10-CM | POA: Diagnosis not present

## 2021-03-01 DIAGNOSIS — D225 Melanocytic nevi of trunk: Secondary | ICD-10-CM | POA: Diagnosis not present

## 2021-03-01 DIAGNOSIS — L905 Scar conditions and fibrosis of skin: Secondary | ICD-10-CM | POA: Diagnosis not present

## 2021-03-01 DIAGNOSIS — L821 Other seborrheic keratosis: Secondary | ICD-10-CM | POA: Diagnosis not present

## 2021-03-07 ENCOUNTER — Ambulatory Visit: Payer: Medicare HMO

## 2021-03-11 ENCOUNTER — Ambulatory Visit: Payer: Medicare HMO

## 2021-03-16 DIAGNOSIS — R5383 Other fatigue: Secondary | ICD-10-CM | POA: Diagnosis not present

## 2021-03-16 DIAGNOSIS — E559 Vitamin D deficiency, unspecified: Secondary | ICD-10-CM | POA: Diagnosis not present

## 2021-03-16 DIAGNOSIS — Z13220 Encounter for screening for lipoid disorders: Secondary | ICD-10-CM | POA: Diagnosis not present

## 2021-03-16 DIAGNOSIS — Z0000 Encounter for general adult medical examination without abnormal findings: Secondary | ICD-10-CM | POA: Diagnosis not present

## 2021-03-16 DIAGNOSIS — I10 Essential (primary) hypertension: Secondary | ICD-10-CM | POA: Diagnosis not present

## 2021-03-16 DIAGNOSIS — R69 Illness, unspecified: Secondary | ICD-10-CM | POA: Diagnosis not present

## 2021-03-16 DIAGNOSIS — R300 Dysuria: Secondary | ICD-10-CM | POA: Diagnosis not present

## 2021-03-16 DIAGNOSIS — Z136 Encounter for screening for cardiovascular disorders: Secondary | ICD-10-CM | POA: Diagnosis not present

## 2021-03-16 DIAGNOSIS — M8580 Other specified disorders of bone density and structure, unspecified site: Secondary | ICD-10-CM | POA: Diagnosis not present

## 2021-03-16 DIAGNOSIS — Z23 Encounter for immunization: Secondary | ICD-10-CM | POA: Diagnosis not present

## 2021-03-21 ENCOUNTER — Other Ambulatory Visit: Payer: Self-pay | Admitting: Family Medicine

## 2021-03-21 DIAGNOSIS — M8580 Other specified disorders of bone density and structure, unspecified site: Secondary | ICD-10-CM

## 2021-04-14 DIAGNOSIS — J302 Other seasonal allergic rhinitis: Secondary | ICD-10-CM | POA: Diagnosis not present

## 2021-04-14 DIAGNOSIS — E7800 Pure hypercholesterolemia, unspecified: Secondary | ICD-10-CM | POA: Diagnosis not present

## 2021-04-14 DIAGNOSIS — I10 Essential (primary) hypertension: Secondary | ICD-10-CM | POA: Diagnosis not present

## 2021-05-21 DIAGNOSIS — Z880 Allergy status to penicillin: Secondary | ICD-10-CM | POA: Diagnosis not present

## 2021-05-21 DIAGNOSIS — G4700 Insomnia, unspecified: Secondary | ICD-10-CM | POA: Diagnosis not present

## 2021-05-21 DIAGNOSIS — Z6825 Body mass index (BMI) 25.0-25.9, adult: Secondary | ICD-10-CM | POA: Diagnosis not present

## 2021-05-21 DIAGNOSIS — N3941 Urge incontinence: Secondary | ICD-10-CM | POA: Diagnosis not present

## 2021-05-21 DIAGNOSIS — Z008 Encounter for other general examination: Secondary | ICD-10-CM | POA: Diagnosis not present

## 2021-05-21 DIAGNOSIS — J301 Allergic rhinitis due to pollen: Secondary | ICD-10-CM | POA: Diagnosis not present

## 2021-05-21 DIAGNOSIS — E663 Overweight: Secondary | ICD-10-CM | POA: Diagnosis not present

## 2021-05-21 DIAGNOSIS — I10 Essential (primary) hypertension: Secondary | ICD-10-CM | POA: Diagnosis not present

## 2021-05-21 DIAGNOSIS — R69 Illness, unspecified: Secondary | ICD-10-CM | POA: Diagnosis not present

## 2021-05-21 DIAGNOSIS — Z8249 Family history of ischemic heart disease and other diseases of the circulatory system: Secondary | ICD-10-CM | POA: Diagnosis not present

## 2021-05-21 DIAGNOSIS — F325 Major depressive disorder, single episode, in full remission: Secondary | ICD-10-CM | POA: Diagnosis not present

## 2021-05-21 DIAGNOSIS — Z833 Family history of diabetes mellitus: Secondary | ICD-10-CM | POA: Diagnosis not present

## 2021-07-14 DIAGNOSIS — R5383 Other fatigue: Secondary | ICD-10-CM | POA: Diagnosis not present

## 2021-07-14 DIAGNOSIS — R69 Illness, unspecified: Secondary | ICD-10-CM | POA: Diagnosis not present

## 2021-07-14 DIAGNOSIS — R944 Abnormal results of kidney function studies: Secondary | ICD-10-CM | POA: Diagnosis not present

## 2021-07-14 DIAGNOSIS — I10 Essential (primary) hypertension: Secondary | ICD-10-CM | POA: Diagnosis not present

## 2021-07-14 DIAGNOSIS — Z23 Encounter for immunization: Secondary | ICD-10-CM | POA: Diagnosis not present

## 2021-07-14 DIAGNOSIS — E7800 Pure hypercholesterolemia, unspecified: Secondary | ICD-10-CM | POA: Diagnosis not present

## 2021-07-14 DIAGNOSIS — F411 Generalized anxiety disorder: Secondary | ICD-10-CM | POA: Diagnosis not present

## 2021-07-14 DIAGNOSIS — E559 Vitamin D deficiency, unspecified: Secondary | ICD-10-CM | POA: Diagnosis not present

## 2021-07-14 DIAGNOSIS — F331 Major depressive disorder, recurrent, moderate: Secondary | ICD-10-CM | POA: Diagnosis not present

## 2021-09-15 DIAGNOSIS — L570 Actinic keratosis: Secondary | ICD-10-CM | POA: Diagnosis not present

## 2021-09-15 DIAGNOSIS — L244 Irritant contact dermatitis due to drugs in contact with skin: Secondary | ICD-10-CM | POA: Diagnosis not present

## 2021-09-15 DIAGNOSIS — L249 Irritant contact dermatitis, unspecified cause: Secondary | ICD-10-CM | POA: Diagnosis not present

## 2021-09-19 ENCOUNTER — Ambulatory Visit
Admission: RE | Admit: 2021-09-19 | Discharge: 2021-09-19 | Disposition: A | Discharge status: Home / Self Care | Payer: Medicare HMO | Source: Ambulatory Visit | Attending: Family Medicine | Admitting: Family Medicine

## 2021-09-19 DIAGNOSIS — M8580 Other specified disorders of bone density and structure, unspecified site: Secondary | ICD-10-CM

## 2021-09-19 DIAGNOSIS — M85851 Other specified disorders of bone density and structure, right thigh: Secondary | ICD-10-CM | POA: Diagnosis not present

## 2021-09-19 DIAGNOSIS — Z780 Asymptomatic menopausal state: Secondary | ICD-10-CM | POA: Diagnosis not present

## 2021-10-17 DIAGNOSIS — L249 Irritant contact dermatitis, unspecified cause: Secondary | ICD-10-CM | POA: Diagnosis not present

## 2021-10-17 DIAGNOSIS — L244 Irritant contact dermatitis due to drugs in contact with skin: Secondary | ICD-10-CM | POA: Diagnosis not present

## 2021-10-17 DIAGNOSIS — L570 Actinic keratosis: Secondary | ICD-10-CM | POA: Diagnosis not present

## 2021-10-20 DIAGNOSIS — H52223 Regular astigmatism, bilateral: Secondary | ICD-10-CM | POA: Diagnosis not present

## 2021-10-20 DIAGNOSIS — H2513 Age-related nuclear cataract, bilateral: Secondary | ICD-10-CM | POA: Diagnosis not present

## 2021-10-20 DIAGNOSIS — H5203 Hypermetropia, bilateral: Secondary | ICD-10-CM | POA: Diagnosis not present

## 2021-10-20 DIAGNOSIS — H25013 Cortical age-related cataract, bilateral: Secondary | ICD-10-CM | POA: Diagnosis not present

## 2021-10-20 DIAGNOSIS — H524 Presbyopia: Secondary | ICD-10-CM | POA: Diagnosis not present

## 2021-11-15 DIAGNOSIS — H2513 Age-related nuclear cataract, bilateral: Secondary | ICD-10-CM | POA: Diagnosis not present

## 2021-11-15 DIAGNOSIS — H18413 Arcus senilis, bilateral: Secondary | ICD-10-CM | POA: Diagnosis not present

## 2021-11-15 DIAGNOSIS — H40013 Open angle with borderline findings, low risk, bilateral: Secondary | ICD-10-CM | POA: Diagnosis not present

## 2021-11-15 DIAGNOSIS — J302 Other seasonal allergic rhinitis: Secondary | ICD-10-CM | POA: Diagnosis not present

## 2021-11-15 DIAGNOSIS — H25013 Cortical age-related cataract, bilateral: Secondary | ICD-10-CM | POA: Diagnosis not present

## 2021-11-15 DIAGNOSIS — H2511 Age-related nuclear cataract, right eye: Secondary | ICD-10-CM | POA: Diagnosis not present

## 2021-11-15 DIAGNOSIS — R69 Illness, unspecified: Secondary | ICD-10-CM | POA: Diagnosis not present

## 2021-11-15 DIAGNOSIS — F411 Generalized anxiety disorder: Secondary | ICD-10-CM | POA: Diagnosis not present

## 2021-11-15 DIAGNOSIS — F331 Major depressive disorder, recurrent, moderate: Secondary | ICD-10-CM | POA: Diagnosis not present

## 2021-11-15 DIAGNOSIS — I10 Essential (primary) hypertension: Secondary | ICD-10-CM | POA: Diagnosis not present

## 2021-11-28 DIAGNOSIS — L570 Actinic keratosis: Secondary | ICD-10-CM | POA: Diagnosis not present

## 2021-11-28 DIAGNOSIS — L814 Other melanin hyperpigmentation: Secondary | ICD-10-CM | POA: Diagnosis not present

## 2021-11-28 DIAGNOSIS — Z09 Encounter for follow-up examination after completed treatment for conditions other than malignant neoplasm: Secondary | ICD-10-CM | POA: Diagnosis not present

## 2021-11-28 DIAGNOSIS — L578 Other skin changes due to chronic exposure to nonionizing radiation: Secondary | ICD-10-CM | POA: Diagnosis not present

## 2022-01-18 DIAGNOSIS — H2511 Age-related nuclear cataract, right eye: Secondary | ICD-10-CM | POA: Diagnosis not present

## 2022-01-18 DIAGNOSIS — Z961 Presence of intraocular lens: Secondary | ICD-10-CM | POA: Diagnosis not present

## 2022-01-19 DIAGNOSIS — Z961 Presence of intraocular lens: Secondary | ICD-10-CM | POA: Diagnosis not present

## 2022-01-26 ENCOUNTER — Other Ambulatory Visit: Payer: Self-pay | Admitting: Family Medicine

## 2022-01-26 DIAGNOSIS — Z1231 Encounter for screening mammogram for malignant neoplasm of breast: Secondary | ICD-10-CM

## 2022-02-01 DIAGNOSIS — H2511 Age-related nuclear cataract, right eye: Secondary | ICD-10-CM | POA: Diagnosis not present

## 2022-02-01 DIAGNOSIS — Z961 Presence of intraocular lens: Secondary | ICD-10-CM | POA: Diagnosis not present

## 2022-02-01 DIAGNOSIS — H2512 Age-related nuclear cataract, left eye: Secondary | ICD-10-CM | POA: Diagnosis not present

## 2022-03-02 ENCOUNTER — Ambulatory Visit
Admission: RE | Admit: 2022-03-02 | Discharge: 2022-03-02 | Disposition: A | Discharge status: Home / Self Care | Payer: Medicare HMO | Source: Ambulatory Visit | Attending: Family Medicine | Admitting: Family Medicine

## 2022-03-02 DIAGNOSIS — Z1231 Encounter for screening mammogram for malignant neoplasm of breast: Secondary | ICD-10-CM

## 2022-03-02 DIAGNOSIS — D485 Neoplasm of uncertain behavior of skin: Secondary | ICD-10-CM | POA: Diagnosis not present

## 2022-03-02 DIAGNOSIS — L814 Other melanin hyperpigmentation: Secondary | ICD-10-CM | POA: Diagnosis not present

## 2022-03-02 DIAGNOSIS — L570 Actinic keratosis: Secondary | ICD-10-CM | POA: Diagnosis not present

## 2022-03-02 DIAGNOSIS — L538 Other specified erythematous conditions: Secondary | ICD-10-CM | POA: Diagnosis not present

## 2022-03-02 DIAGNOSIS — D225 Melanocytic nevi of trunk: Secondary | ICD-10-CM | POA: Diagnosis not present

## 2022-03-02 DIAGNOSIS — L821 Other seborrheic keratosis: Secondary | ICD-10-CM | POA: Diagnosis not present

## 2022-03-02 DIAGNOSIS — C44629 Squamous cell carcinoma of skin of left upper limb, including shoulder: Secondary | ICD-10-CM | POA: Diagnosis not present

## 2022-03-06 ENCOUNTER — Other Ambulatory Visit: Payer: Self-pay | Admitting: Family Medicine

## 2022-03-06 DIAGNOSIS — R928 Other abnormal and inconclusive findings on diagnostic imaging of breast: Secondary | ICD-10-CM

## 2022-03-13 ENCOUNTER — Ambulatory Visit
Admission: RE | Admit: 2022-03-13 | Discharge: 2022-03-13 | Disposition: A | Discharge status: Home / Self Care | Payer: Medicare HMO | Source: Ambulatory Visit | Attending: Family Medicine | Admitting: Family Medicine

## 2022-03-13 ENCOUNTER — Other Ambulatory Visit: Payer: Self-pay | Admitting: Family Medicine

## 2022-03-13 DIAGNOSIS — N6310 Unspecified lump in the right breast, unspecified quadrant: Secondary | ICD-10-CM

## 2022-03-13 DIAGNOSIS — R928 Other abnormal and inconclusive findings on diagnostic imaging of breast: Secondary | ICD-10-CM

## 2022-03-13 DIAGNOSIS — N6489 Other specified disorders of breast: Secondary | ICD-10-CM

## 2022-03-13 DIAGNOSIS — N6311 Unspecified lump in the right breast, upper outer quadrant: Secondary | ICD-10-CM | POA: Diagnosis not present

## 2022-03-13 DIAGNOSIS — R922 Inconclusive mammogram: Secondary | ICD-10-CM | POA: Diagnosis not present

## 2022-03-15 ENCOUNTER — Ambulatory Visit
Admission: RE | Admit: 2022-03-15 | Discharge: 2022-03-15 | Disposition: A | Discharge status: Home / Self Care | Payer: Medicare HMO | Source: Ambulatory Visit | Attending: Family Medicine | Admitting: Family Medicine

## 2022-03-15 DIAGNOSIS — N6021 Fibroadenosis of right breast: Secondary | ICD-10-CM | POA: Diagnosis not present

## 2022-03-15 DIAGNOSIS — N6311 Unspecified lump in the right breast, upper outer quadrant: Secondary | ICD-10-CM | POA: Diagnosis not present

## 2022-03-15 DIAGNOSIS — N6489 Other specified disorders of breast: Secondary | ICD-10-CM

## 2022-03-15 DIAGNOSIS — N6310 Unspecified lump in the right breast, unspecified quadrant: Secondary | ICD-10-CM

## 2022-03-18 HISTORY — PX: BREAST BIOPSY: SHX20

## 2022-03-28 DIAGNOSIS — M8580 Other specified disorders of bone density and structure, unspecified site: Secondary | ICD-10-CM | POA: Diagnosis not present

## 2022-03-28 DIAGNOSIS — F419 Anxiety disorder, unspecified: Secondary | ICD-10-CM | POA: Diagnosis not present

## 2022-03-28 DIAGNOSIS — F331 Major depressive disorder, recurrent, moderate: Secondary | ICD-10-CM | POA: Diagnosis not present

## 2022-03-28 DIAGNOSIS — Z23 Encounter for immunization: Secondary | ICD-10-CM | POA: Diagnosis not present

## 2022-03-28 DIAGNOSIS — J302 Other seasonal allergic rhinitis: Secondary | ICD-10-CM | POA: Diagnosis not present

## 2022-03-28 DIAGNOSIS — E7800 Pure hypercholesterolemia, unspecified: Secondary | ICD-10-CM | POA: Diagnosis not present

## 2022-03-28 DIAGNOSIS — I10 Essential (primary) hypertension: Secondary | ICD-10-CM | POA: Diagnosis not present

## 2022-03-28 DIAGNOSIS — R69 Illness, unspecified: Secondary | ICD-10-CM | POA: Diagnosis not present

## 2022-03-28 DIAGNOSIS — Z0000 Encounter for general adult medical examination without abnormal findings: Secondary | ICD-10-CM | POA: Diagnosis not present

## 2022-03-28 DIAGNOSIS — E559 Vitamin D deficiency, unspecified: Secondary | ICD-10-CM | POA: Diagnosis not present

## 2022-03-28 DIAGNOSIS — M546 Pain in thoracic spine: Secondary | ICD-10-CM | POA: Diagnosis not present

## 2022-05-08 DIAGNOSIS — I10 Essential (primary) hypertension: Secondary | ICD-10-CM | POA: Diagnosis not present

## 2022-05-08 DIAGNOSIS — Z833 Family history of diabetes mellitus: Secondary | ICD-10-CM | POA: Diagnosis not present

## 2022-05-08 DIAGNOSIS — G8929 Other chronic pain: Secondary | ICD-10-CM | POA: Diagnosis not present

## 2022-05-08 DIAGNOSIS — N3941 Urge incontinence: Secondary | ICD-10-CM | POA: Diagnosis not present

## 2022-05-08 DIAGNOSIS — Z8249 Family history of ischemic heart disease and other diseases of the circulatory system: Secondary | ICD-10-CM | POA: Diagnosis not present

## 2022-05-08 DIAGNOSIS — Z85828 Personal history of other malignant neoplasm of skin: Secondary | ICD-10-CM | POA: Diagnosis not present

## 2022-05-08 DIAGNOSIS — E785 Hyperlipidemia, unspecified: Secondary | ICD-10-CM | POA: Diagnosis not present

## 2022-05-08 DIAGNOSIS — R69 Illness, unspecified: Secondary | ICD-10-CM | POA: Diagnosis not present

## 2022-05-16 DIAGNOSIS — C44629 Squamous cell carcinoma of skin of left upper limb, including shoulder: Secondary | ICD-10-CM | POA: Diagnosis not present

## 2022-06-21 DIAGNOSIS — Z23 Encounter for immunization: Secondary | ICD-10-CM | POA: Diagnosis not present

## 2022-06-21 DIAGNOSIS — I10 Essential (primary) hypertension: Secondary | ICD-10-CM | POA: Diagnosis not present

## 2022-06-21 DIAGNOSIS — R69 Illness, unspecified: Secondary | ICD-10-CM | POA: Diagnosis not present

## 2022-07-11 DIAGNOSIS — H1045 Other chronic allergic conjunctivitis: Secondary | ICD-10-CM | POA: Diagnosis not present

## 2022-07-11 DIAGNOSIS — H01002 Unspecified blepharitis right lower eyelid: Secondary | ICD-10-CM | POA: Diagnosis not present

## 2022-07-11 DIAGNOSIS — H01005 Unspecified blepharitis left lower eyelid: Secondary | ICD-10-CM | POA: Diagnosis not present

## 2022-07-19 DIAGNOSIS — I10 Essential (primary) hypertension: Secondary | ICD-10-CM | POA: Diagnosis not present

## 2022-09-28 DIAGNOSIS — R32 Unspecified urinary incontinence: Secondary | ICD-10-CM | POA: Diagnosis not present

## 2022-09-28 DIAGNOSIS — I10 Essential (primary) hypertension: Secondary | ICD-10-CM | POA: Diagnosis not present

## 2022-09-28 DIAGNOSIS — E7800 Pure hypercholesterolemia, unspecified: Secondary | ICD-10-CM | POA: Diagnosis not present

## 2022-09-28 DIAGNOSIS — R69 Illness, unspecified: Secondary | ICD-10-CM | POA: Diagnosis not present

## 2022-09-28 DIAGNOSIS — R350 Frequency of micturition: Secondary | ICD-10-CM | POA: Diagnosis not present

## 2022-10-28 IMAGING — MG MM DIGITAL SCREENING BILAT W/ TOMO AND CAD
5 series · 6 of 17 positions shown · non-contrast
Comparison: Previous exam(s).

CLINICAL DATA: Screening.

EXAM:
DIGITAL SCREENING BILATERAL MAMMOGRAM WITH TOMOSYNTHESIS AND CAD
TECHNIQUE: Bilateral screening digital craniocaudal and mediolateral oblique
mammograms were obtained. Bilateral screening digital breast
tomosynthesis was performed. The images were evaluated with
computer-aided detection.

[R CC synth-2D]
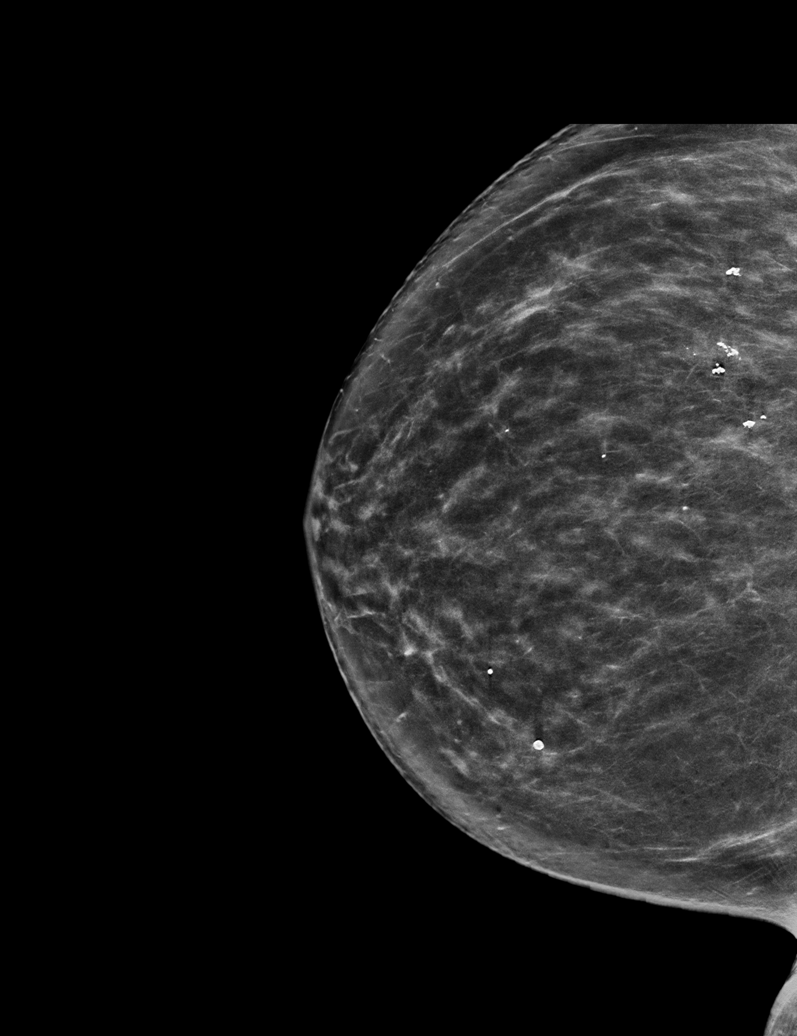

[L CC synth-2D]
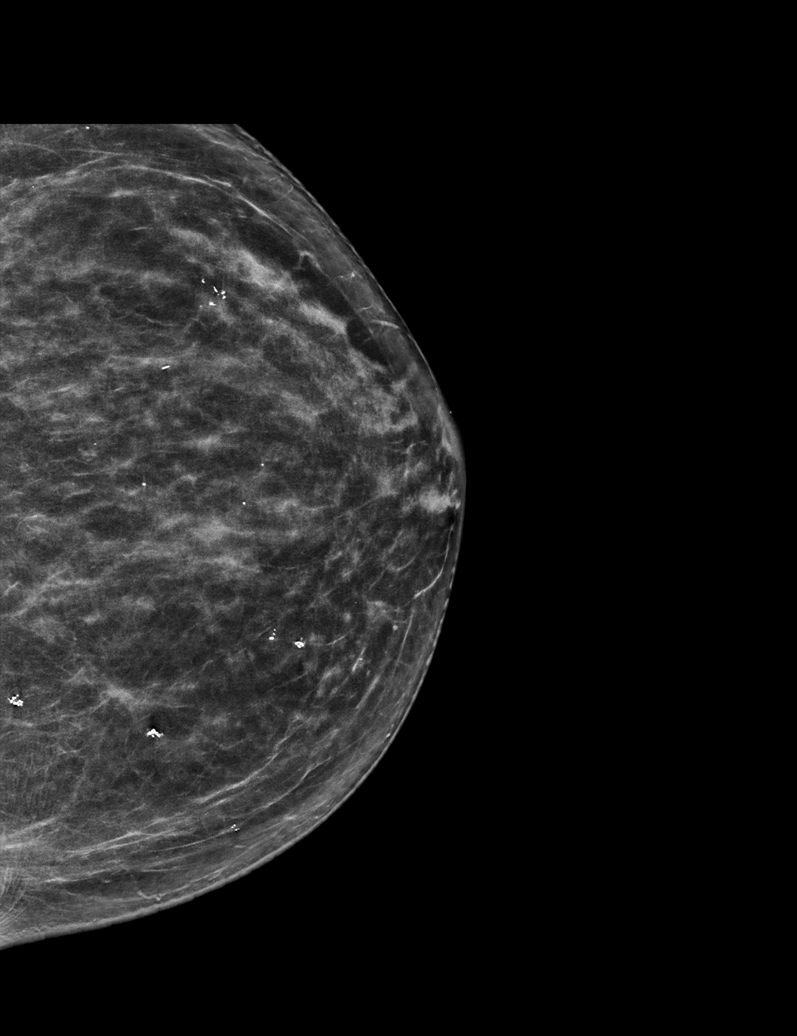

[R MLO tomo · 2 of 75 frames shown]
[frame 25/75]
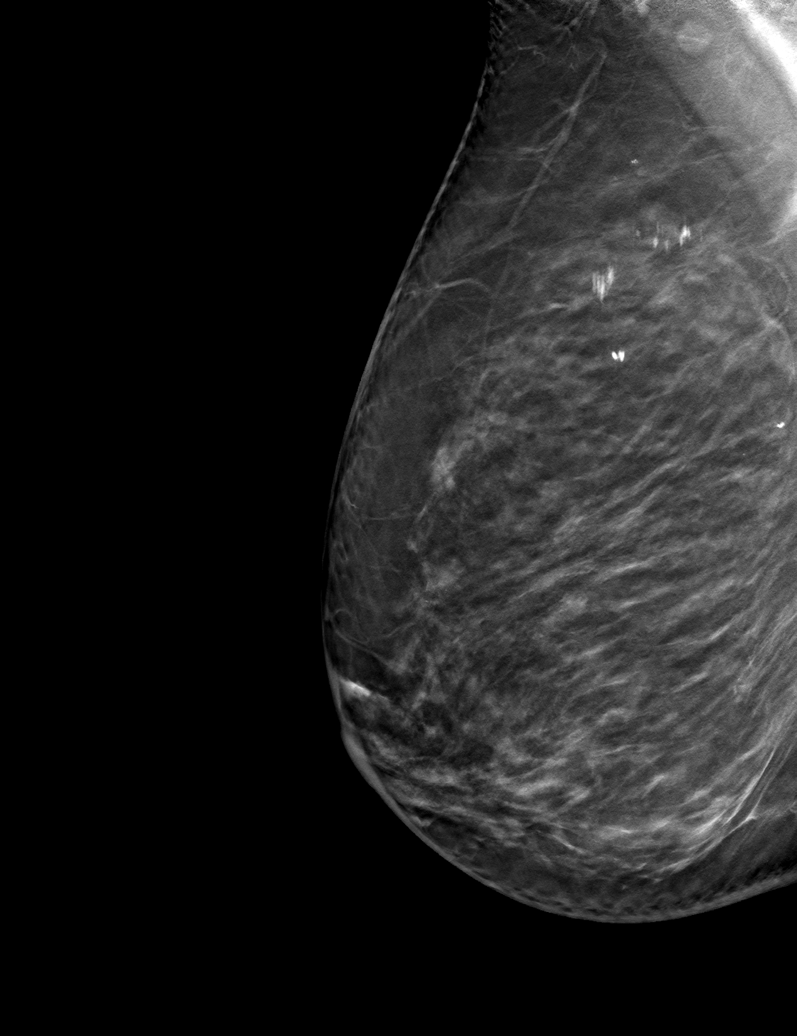
[frame 38/75]
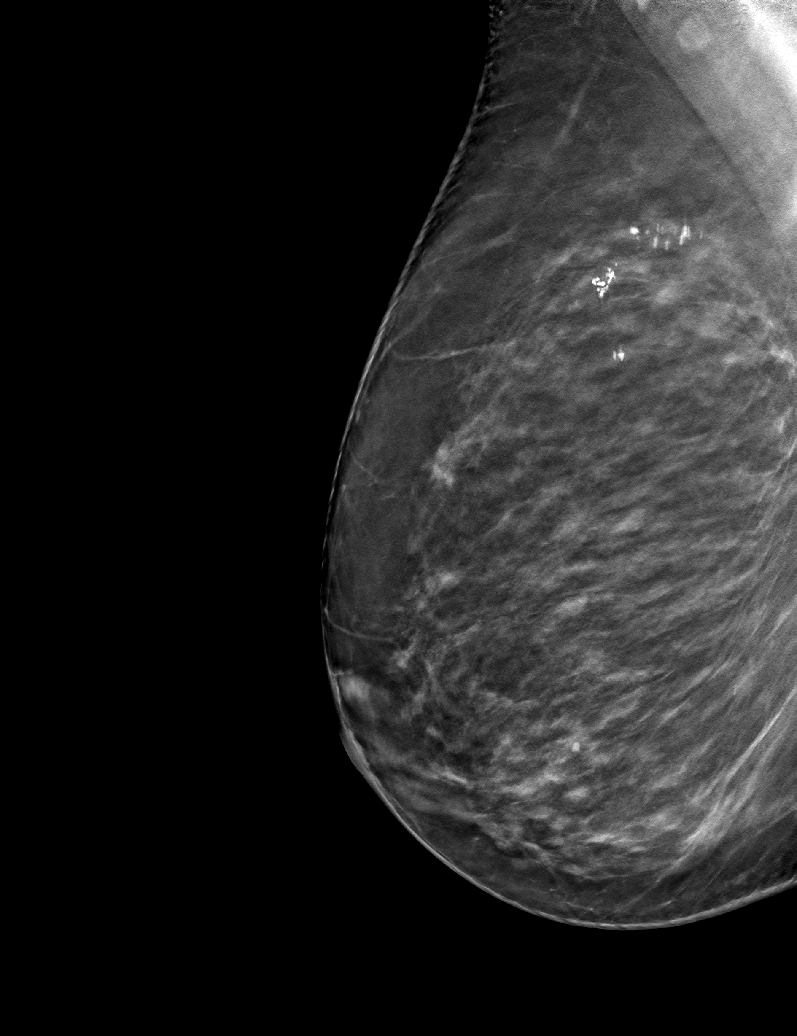

[R CC tomo · tomo slice 36/71.0]
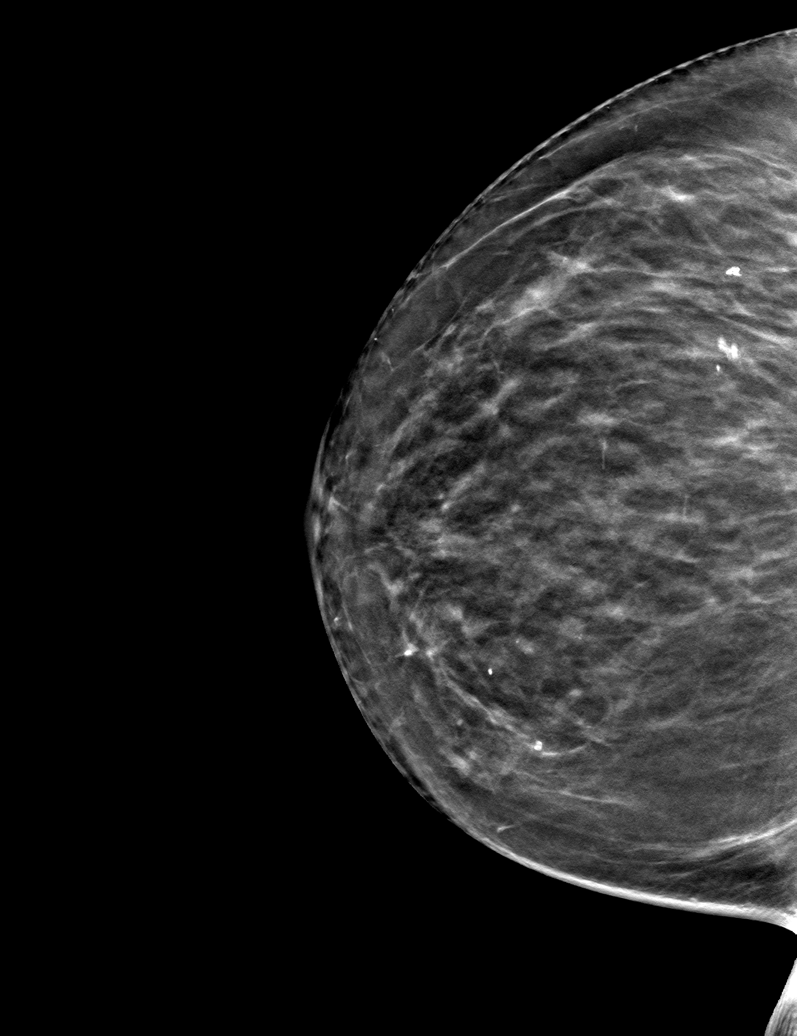

[L CC tomo · tomo slice 33/64.0]
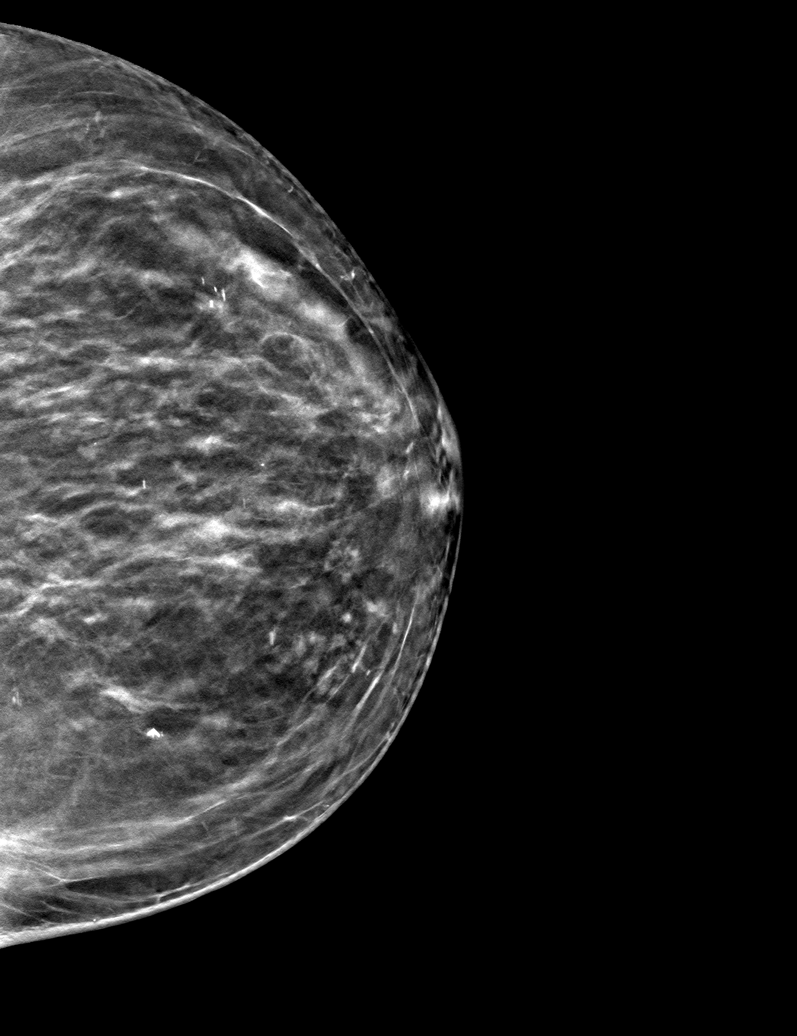

[6 of 17 positions shown; findings below may reference images not displayed]

ACR Breast Density Category b: There are scattered areas of
fibroglandular density.
FINDINGS: There are no findings suspicious for malignancy.
IMPRESSION: No mammographic evidence of malignancy. A result letter of this
screening mammogram will be mailed directly to the patient.

RECOMMENDATION:
Screening mammogram in one year. (Code:51-O-LD2)

BI-RADS CATEGORY  1: Negative.

## 2022-12-07 DIAGNOSIS — R69 Illness, unspecified: Secondary | ICD-10-CM | POA: Diagnosis not present

## 2022-12-07 DIAGNOSIS — I10 Essential (primary) hypertension: Secondary | ICD-10-CM | POA: Diagnosis not present

## 2023-01-15 DIAGNOSIS — Z9842 Cataract extraction status, left eye: Secondary | ICD-10-CM | POA: Diagnosis not present

## 2023-01-15 DIAGNOSIS — H5212 Myopia, left eye: Secondary | ICD-10-CM | POA: Diagnosis not present

## 2023-01-15 DIAGNOSIS — Z9841 Cataract extraction status, right eye: Secondary | ICD-10-CM | POA: Diagnosis not present

## 2023-02-02 ENCOUNTER — Other Ambulatory Visit: Payer: Self-pay | Admitting: Family Medicine

## 2023-02-02 DIAGNOSIS — Z1231 Encounter for screening mammogram for malignant neoplasm of breast: Secondary | ICD-10-CM

## 2023-03-06 ENCOUNTER — Ambulatory Visit
Admission: RE | Admit: 2023-03-06 | Discharge: 2023-03-06 | Disposition: A | Discharge status: Home / Self Care | Payer: Medicare HMO | Source: Ambulatory Visit | Attending: Family Medicine | Admitting: Family Medicine

## 2023-03-06 DIAGNOSIS — Z1231 Encounter for screening mammogram for malignant neoplasm of breast: Secondary | ICD-10-CM | POA: Diagnosis not present

## 2023-03-08 DIAGNOSIS — C44729 Squamous cell carcinoma of skin of left lower limb, including hip: Secondary | ICD-10-CM | POA: Diagnosis not present

## 2023-03-08 DIAGNOSIS — D225 Melanocytic nevi of trunk: Secondary | ICD-10-CM | POA: Diagnosis not present

## 2023-03-08 DIAGNOSIS — D485 Neoplasm of uncertain behavior of skin: Secondary | ICD-10-CM | POA: Diagnosis not present

## 2023-03-08 DIAGNOSIS — L814 Other melanin hyperpigmentation: Secondary | ICD-10-CM | POA: Diagnosis not present

## 2023-03-08 DIAGNOSIS — L821 Other seborrheic keratosis: Secondary | ICD-10-CM | POA: Diagnosis not present

## 2023-03-08 DIAGNOSIS — L570 Actinic keratosis: Secondary | ICD-10-CM | POA: Diagnosis not present

## 2023-04-05 DIAGNOSIS — Z136 Encounter for screening for cardiovascular disorders: Secondary | ICD-10-CM | POA: Diagnosis not present

## 2023-04-05 DIAGNOSIS — Z0000 Encounter for general adult medical examination without abnormal findings: Secondary | ICD-10-CM | POA: Diagnosis not present

## 2023-04-05 DIAGNOSIS — F419 Anxiety disorder, unspecified: Secondary | ICD-10-CM | POA: Diagnosis not present

## 2023-04-05 DIAGNOSIS — I10 Essential (primary) hypertension: Secondary | ICD-10-CM | POA: Diagnosis not present

## 2023-04-05 DIAGNOSIS — Z13220 Encounter for screening for lipoid disorders: Secondary | ICD-10-CM | POA: Diagnosis not present

## 2023-04-05 DIAGNOSIS — M8580 Other specified disorders of bone density and structure, unspecified site: Secondary | ICD-10-CM | POA: Diagnosis not present

## 2023-04-05 DIAGNOSIS — M79602 Pain in left arm: Secondary | ICD-10-CM | POA: Diagnosis not present

## 2023-04-05 DIAGNOSIS — F331 Major depressive disorder, recurrent, moderate: Secondary | ICD-10-CM | POA: Diagnosis not present

## 2023-04-09 ENCOUNTER — Other Ambulatory Visit: Payer: Self-pay | Admitting: Family Medicine

## 2023-04-09 DIAGNOSIS — M8580 Other specified disorders of bone density and structure, unspecified site: Secondary | ICD-10-CM

## 2023-04-19 DIAGNOSIS — C44729 Squamous cell carcinoma of skin of left lower limb, including hip: Secondary | ICD-10-CM | POA: Diagnosis not present

## 2023-04-19 DIAGNOSIS — I872 Venous insufficiency (chronic) (peripheral): Secondary | ICD-10-CM | POA: Diagnosis not present

## 2023-04-24 DIAGNOSIS — Z79899 Other long term (current) drug therapy: Secondary | ICD-10-CM | POA: Diagnosis not present

## 2023-04-24 DIAGNOSIS — F325 Major depressive disorder, single episode, in full remission: Secondary | ICD-10-CM | POA: Diagnosis not present

## 2023-04-24 DIAGNOSIS — I10 Essential (primary) hypertension: Secondary | ICD-10-CM | POA: Diagnosis not present

## 2023-04-24 DIAGNOSIS — Z8249 Family history of ischemic heart disease and other diseases of the circulatory system: Secondary | ICD-10-CM | POA: Diagnosis not present

## 2023-04-24 DIAGNOSIS — E785 Hyperlipidemia, unspecified: Secondary | ICD-10-CM | POA: Diagnosis not present

## 2023-04-24 DIAGNOSIS — F411 Generalized anxiety disorder: Secondary | ICD-10-CM | POA: Diagnosis not present

## 2023-04-24 DIAGNOSIS — J309 Allergic rhinitis, unspecified: Secondary | ICD-10-CM | POA: Diagnosis not present

## 2023-04-24 DIAGNOSIS — Z85828 Personal history of other malignant neoplasm of skin: Secondary | ICD-10-CM | POA: Diagnosis not present

## 2023-04-24 DIAGNOSIS — K08409 Partial loss of teeth, unspecified cause, unspecified class: Secondary | ICD-10-CM | POA: Diagnosis not present

## 2023-06-22 DIAGNOSIS — R5383 Other fatigue: Secondary | ICD-10-CM | POA: Diagnosis not present

## 2023-06-22 DIAGNOSIS — R0609 Other forms of dyspnea: Secondary | ICD-10-CM | POA: Diagnosis not present

## 2023-06-25 ENCOUNTER — Ambulatory Visit
Admission: RE | Admit: 2023-06-25 | Discharge: 2023-06-25 | Disposition: A | Discharge status: Home / Self Care | Payer: Medicare HMO | Source: Ambulatory Visit | Attending: Family Medicine | Admitting: Family Medicine

## 2023-06-25 ENCOUNTER — Other Ambulatory Visit: Payer: Self-pay | Admitting: Family Medicine

## 2023-06-25 DIAGNOSIS — R0609 Other forms of dyspnea: Secondary | ICD-10-CM

## 2023-07-17 DIAGNOSIS — R197 Diarrhea, unspecified: Secondary | ICD-10-CM | POA: Diagnosis not present

## 2023-07-18 ENCOUNTER — Other Ambulatory Visit: Payer: Self-pay | Admitting: Family Medicine

## 2023-07-18 DIAGNOSIS — J986 Disorders of diaphragm: Secondary | ICD-10-CM

## 2023-07-19 ENCOUNTER — Encounter: Payer: Self-pay | Admitting: Family Medicine

## 2023-07-24 ENCOUNTER — Ambulatory Visit
Admission: RE | Admit: 2023-07-24 | Discharge: 2023-07-24 | Disposition: A | Discharge status: Home / Self Care | Payer: Medicare HMO | Source: Ambulatory Visit | Attending: Family Medicine | Admitting: Family Medicine

## 2023-07-24 DIAGNOSIS — J90 Pleural effusion, not elsewhere classified: Secondary | ICD-10-CM | POA: Diagnosis not present

## 2023-07-24 DIAGNOSIS — J986 Disorders of diaphragm: Secondary | ICD-10-CM

## 2023-07-24 DIAGNOSIS — I700 Atherosclerosis of aorta: Secondary | ICD-10-CM | POA: Diagnosis not present

## 2023-07-24 DIAGNOSIS — K449 Diaphragmatic hernia without obstruction or gangrene: Secondary | ICD-10-CM | POA: Diagnosis not present

## 2023-07-24 DIAGNOSIS — I2510 Atherosclerotic heart disease of native coronary artery without angina pectoris: Secondary | ICD-10-CM | POA: Diagnosis not present

## 2023-08-20 ENCOUNTER — Encounter: Payer: Self-pay | Admitting: Internal Medicine

## 2023-08-20 ENCOUNTER — Ambulatory Visit: Payer: Medicare HMO | Attending: Internal Medicine | Admitting: Internal Medicine

## 2023-08-20 VITALS — BP 140/62 | HR 73 | Ht 61.0 in | Wt 138.4 lb

## 2023-08-20 DIAGNOSIS — R0600 Dyspnea, unspecified: Secondary | ICD-10-CM

## 2023-08-20 DIAGNOSIS — Z09 Encounter for follow-up examination after completed treatment for conditions other than malignant neoplasm: Secondary | ICD-10-CM | POA: Diagnosis not present

## 2023-08-20 DIAGNOSIS — Z79899 Other long term (current) drug therapy: Secondary | ICD-10-CM

## 2023-08-20 DIAGNOSIS — E7800 Pure hypercholesterolemia, unspecified: Secondary | ICD-10-CM | POA: Diagnosis not present

## 2023-08-20 MED ORDER — ROSUVASTATIN CALCIUM 10 MG PO TABS: 10.0000 mg | ORAL_TABLET | Freq: Every day | ORAL | 3 refills | Status: DC

## 2023-08-20 NOTE — Progress Notes (Signed)
Cardiology Office Note   Date:  08/20/2023   ID:  Monica Fuller, DOB 11-Oct-1940, MRN [id number]  PCP:  Mardella Layman, MD  Cardiologist:   Dietrich Pates, MD   Pt referred for DOE     History of Present Illness: Monica Fuller is a 82 y.o. female with a history of HTN, arthritis,HL,   PT follows with Fleet Contras hagler    Had some SOB   Started 3 months ago  Walking to mail box or walking with dog        Now it is improved  Can go without SOB   Pt denies  CP  No PND   No palpitations    No dizziness  CT of chest on 11/29 showed coronary calcifications   Large hiatal hernia       Current Meds  Medication Sig   amLODipine (NORVASC) 2.5 MG tablet Take 2.5 mg by mouth 2 (two) times daily.   benazepril-hydrochlorthiazide (LOTENSIN HCT) 20-25 MG tablet Take 1 tablet by mouth daily.   Calcium Carbonate (CALCIUM 600 PO) Take 2 capsules by mouth daily.   cholecalciferol (VITAMIN D) 1000 UNITS tablet Take 1,000 Units by mouth daily.   clonazePAM (KLONOPIN) 0.5 MG tablet TAKE 1 TABLET (0.5 MG TOTAL) BY MOUTH AT BEDTIME.   fish oil-omega-3 fatty acids 1000 MG capsule Take 1,000 mg by mouth daily.     fluticasone (FLONASE) 50 MCG/ACT nasal spray Place 2 sprays into both nostrils daily.   hydrALAZINE (APRESOLINE) 10 MG tablet Take 30 mg by mouth 2 (two) times daily.   Multiple Vitamin (MULTIVITAMIN) capsule Take 1 capsule by mouth daily.     Red Yeast Rice Extract (RED YEAST RICE PO) Take 1 capsule by mouth daily.   sertraline (ZOLOFT) 100 MG tablet TAKE 1 AND 1/2 TABLETS BY MOUTH EVERY DAY   vitamin B-12 (CYANOCOBALAMIN) 1000 MCG tablet Take 1,000 mcg by mouth daily.     Allergies:   Statins and Codeine   Past Medical History:  Diagnosis Date   Allergy    Arthritis    Depression    Hypertension     Past Surgical History:  Procedure Laterality Date   REPLACEMENT TOTAL KNEE  2010   left     Social History:  The patient  reports that she has never smoked. She has never used smokeless  tobacco. She reports that she does not drink alcohol.   Family History:  The patient's family history includes Breast cancer in an other family member; Cancer in her mother; Diabetes in her brother and father; Heart disease in her father and mother; Hypertension in her brother, father, mother, and sister; Stroke in her mother.    ROS:  Please see the history of present illness. All other systems are reviewed and  Negative to the above problem except as noted.    PHYSICAL EXAM: VS:  BP (!) 140/62   Pulse 73   Ht 5\' 1"  (1.549 m)   Wt 138 lb 6.4 oz (62.8 kg)   SpO2 94%   BMI 26.15 kg/m   GEN: Well nourished, well developed, in no acute distress  HEENT: normal  Neck: no JVD, carotid bruits Cardiac: RRR; no murmurs  No LE edema  Respiratory:  clear to auscultation bilaterally  Decreased BS at L base   GI: soft, nontender, nondistended,   No hepatomegaly  MS: no deformity Moving all extremities    EKG:  EKG is ordered today.  NSR 73 bpm  Lipid Panel    Component Value Date/Time   CHOL 234 (H) 04/22/2019 0832   TRIG 142.0 04/22/2019 0832   HDL 71.20 04/22/2019 0832   CHOLHDL 3 04/22/2019 0832   VLDL 28.4 04/22/2019 0832   LDLCALC 134 (H) 04/22/2019 0832   LDLDIRECT 144.0 10/04/2015 0914      Wt Readings from Last 3 Encounters:  08/20/23 138 lb 6.4 oz (62.8 kg)  06/12/19 163 lb (73.9 kg)  06/03/19 168 lb (76.2 kg)      ASSESSMENT AND PLAN:  1 DOE  Pt with 3 month hx of DOE   She says it has actually gotten better    She is not too active though  CT in Nov 2024 showed coronary calcifications (not severe)    Also shows large hiatal hernia  Would recomm echo to eval LVEF/RVEF   Consider noninvasive eval, though symptoms have improved   2  CAD   NOted on CT scan  Risk factor modify  3  HL  Woul recomm she stop Red yeast rice  Start Crestor 10 mg   Check lipomed, lpa andApo B along with liver panel in 8 wks   4  HTN  BP runs about 140 all the time   Check BMET   Consider low dose hydrochlorothiazide  5  Hiatal hernia  large   Follow up based on test results      Current medicines are reviewed at length with the patient today.  The patient does not have concerns regarding medicines.  Signed, Dietrich Pates, MD  08/20/2023 9:19 AM    [facility] [address] [place], [place], [place]  [address] Phone: [phone]; Fax: [phone]

## 2023-08-20 NOTE — Patient Instructions (Addendum)
Medication Instructions:  Please START taking crestor 10 mg.   Please STOP taking red yeast rice.   *If you need a refill on your cardiac medications before your next appointment, please call your pharmacy*   Lab Work: Please complete a BMET in our first floor lab today before you leave.  Please complete a lipoprotein a, apolipoprotein b and a hepatic function test in early February 2025.  If you have labs (blood work) drawn today and your tests are completely normal, you will receive your results only by: MyChart Message (if you have MyChart) OR A paper copy in the mail If you have any lab test that is abnormal or we need to change your treatment, we will call you to review the results.   Testing/Procedures: Your physician has requested that you have an echocardiogram. Echocardiography is a painless test that uses sound waves to create images of your heart. It provides your doctor with information about the size and shape of your heart and how well your heart's chambers and valves are working. This procedure takes approximately one hour. There are no restrictions for this procedure. Please do NOT wear cologne, perfume, aftershave, or lotions (deodorant is allowed). Please arrive 15 minutes prior to your appointment time.  Please note: We ask at that you not bring children with you during ultrasound (echo/ vascular) testing. Due to room size and safety concerns, children are not allowed in the ultrasound rooms during exams. Our front office staff cannot provide observation of children in our lobby area while testing is being conducted. An adult accompanying a patient to their appointment will only be allowed in the ultrasound room at the discretion of the ultrasound technician under special circumstances. We apologize for any inconvenience.    Follow-Up: At [facility], you and your health needs are our priority.  As part of our continuing mission to provide you with  exceptional heart care, we have created designated Provider Care Teams.  These Care Teams include your primary Cardiologist (physician) and Advanced Practice Providers (APPs -  Physician Assistants and Nurse Practitioners) who all work together to provide you with the care you need, when you need it.  We recommend signing up for the patient portal called "MyChart".  Sign up information is provided on this After Visit Summary.  MyChart is used to connect with patients for Virtual Visits (Telemedicine).  Patients are able to view lab/test results, encounter notes, upcoming appointments, etc.  Non-urgent messages can be sent to your provider as well.   To learn more about what you can do with MyChart, go to [email or web address].    Your next appointment:   1 year(s)  Provider:   Dr. Dietrich Pates, MD

## 2023-08-21 LAB — HEPATIC FUNCTION PANEL
ALT: 27 [IU]/L (ref 0–32)
AST: 37 [IU]/L (ref 0–40)
Albumin: 5 g/dL — ABNORMAL HIGH (ref 3.7–4.7)
Alkaline Phosphatase: 72 [IU]/L (ref 44–121)
Bilirubin Total: 0.5 mg/dL (ref 0.0–1.2)
Bilirubin, Direct: 0.18 mg/dL (ref 0.00–0.40)
Total Protein: 7.2 g/dL (ref 6.0–8.5)

## 2023-08-21 LAB — BASIC METABOLIC PANEL
BUN/Creatinine Ratio: 20 (ref 12–28)
BUN: 17 mg/dL (ref 8–27)
CO2: 26 mmol/L (ref 20–29)
Calcium: 10.2 mg/dL (ref 8.7–10.3)
Chloride: 102 mmol/L (ref 96–106)
Creatinine, Ser: 0.87 mg/dL (ref 0.57–1.00)
Glucose: 93 mg/dL (ref 70–99)
Potassium: 4.7 mmol/L (ref 3.5–5.2)
Sodium: 141 mmol/L (ref 134–144)
eGFR: 66 mL/min/{1.73_m2} (ref >59)

## 2023-08-21 LAB — APOLIPOPROTEIN B: Apolipoprotein B: 110 mg/dL — ABNORMAL HIGH (ref <90)

## 2023-08-21 LAB — LIPOPROTEIN A (LPA): Lipoprotein (a): 97.8 nmol/L — ABNORMAL HIGH (ref <75.0)

## 2023-09-13 DIAGNOSIS — C44729 Squamous cell carcinoma of skin of left lower limb, including hip: Secondary | ICD-10-CM | POA: Diagnosis not present

## 2023-09-13 DIAGNOSIS — D485 Neoplasm of uncertain behavior of skin: Secondary | ICD-10-CM | POA: Diagnosis not present

## 2023-09-26 ENCOUNTER — Ambulatory Visit (HOSPITAL_COMMUNITY): Payer: Medicare HMO | Attending: Internal Medicine

## 2023-09-26 DIAGNOSIS — R0600 Dyspnea, unspecified: Secondary | ICD-10-CM | POA: Diagnosis not present

## 2023-09-26 LAB — ECHOCARDIOGRAM COMPLETE
MV M vel: 5.63 m/s
MV Peak grad: 126.8 mm[Hg]
Radius: 0.3 cm
S' Lateral: 2.75 cm

## 2023-10-01 ENCOUNTER — Telehealth: Payer: Self-pay | Admitting: Internal Medicine

## 2023-10-01 NOTE — Telephone Encounter (Signed)
Pt advised that Dr Tenny Craw has her Echo results and I will call her after Dr Tenny Craw has finished reviewing it... pt verbalized understanding.. She says that she is doing well.

## 2023-10-01 NOTE — Telephone Encounter (Signed)
Pt is requesting a callback regarding ECHO results. Please advise

## 2023-10-08 DIAGNOSIS — C44729 Squamous cell carcinoma of skin of left lower limb, including hip: Secondary | ICD-10-CM | POA: Diagnosis not present

## 2023-10-16 DIAGNOSIS — C44729 Squamous cell carcinoma of skin of left lower limb, including hip: Secondary | ICD-10-CM | POA: Diagnosis not present

## 2023-10-23 DIAGNOSIS — C44729 Squamous cell carcinoma of skin of left lower limb, including hip: Secondary | ICD-10-CM | POA: Diagnosis not present

## 2023-11-07 DIAGNOSIS — R634 Abnormal weight loss: Secondary | ICD-10-CM | POA: Diagnosis not present

## 2023-11-27 DIAGNOSIS — R944 Abnormal results of kidney function studies: Secondary | ICD-10-CM | POA: Diagnosis not present

## 2023-12-04 DIAGNOSIS — L989 Disorder of the skin and subcutaneous tissue, unspecified: Secondary | ICD-10-CM | POA: Diagnosis not present

## 2023-12-04 DIAGNOSIS — S81802A Unspecified open wound, left lower leg, initial encounter: Secondary | ICD-10-CM | POA: Diagnosis not present

## 2023-12-27 ENCOUNTER — Other Ambulatory Visit: Payer: Self-pay | Admitting: Family Medicine

## 2023-12-27 ENCOUNTER — Ambulatory Visit
Admission: RE | Admit: 2023-12-27 | Discharge: 2023-12-27 | Disposition: A | Discharge status: Home / Self Care | Source: Ambulatory Visit | Attending: Family Medicine | Admitting: Family Medicine

## 2023-12-27 DIAGNOSIS — M2170 Unequal limb length (acquired), unspecified site: Secondary | ICD-10-CM | POA: Diagnosis not present

## 2023-12-27 DIAGNOSIS — F331 Major depressive disorder, recurrent, moderate: Secondary | ICD-10-CM | POA: Diagnosis not present

## 2023-12-27 DIAGNOSIS — R634 Abnormal weight loss: Secondary | ICD-10-CM

## 2023-12-27 DIAGNOSIS — R269 Unspecified abnormalities of gait and mobility: Secondary | ICD-10-CM

## 2023-12-27 DIAGNOSIS — I10 Essential (primary) hypertension: Secondary | ICD-10-CM | POA: Diagnosis not present

## 2023-12-27 DIAGNOSIS — F419 Anxiety disorder, unspecified: Secondary | ICD-10-CM | POA: Diagnosis not present

## 2023-12-27 DIAGNOSIS — E7800 Pure hypercholesterolemia, unspecified: Secondary | ICD-10-CM | POA: Diagnosis not present

## 2023-12-31 DIAGNOSIS — R634 Abnormal weight loss: Secondary | ICD-10-CM | POA: Diagnosis not present

## 2024-01-14 DIAGNOSIS — Z9841 Cataract extraction status, right eye: Secondary | ICD-10-CM | POA: Diagnosis not present

## 2024-01-14 DIAGNOSIS — H5212 Myopia, left eye: Secondary | ICD-10-CM | POA: Diagnosis not present

## 2024-01-14 DIAGNOSIS — Z9842 Cataract extraction status, left eye: Secondary | ICD-10-CM | POA: Diagnosis not present

## 2024-01-16 DIAGNOSIS — K449 Diaphragmatic hernia without obstruction or gangrene: Secondary | ICD-10-CM | POA: Diagnosis not present

## 2024-01-25 ENCOUNTER — Ambulatory Visit
Admission: RE | Admit: 2024-01-25 | Discharge: 2024-01-25 | Disposition: A | Discharge status: Home / Self Care | Payer: Medicare HMO | Source: Ambulatory Visit | Attending: Family Medicine | Admitting: Family Medicine

## 2024-01-25 DIAGNOSIS — N958 Other specified menopausal and perimenopausal disorders: Secondary | ICD-10-CM | POA: Diagnosis not present

## 2024-01-25 DIAGNOSIS — M8580 Other specified disorders of bone density and structure, unspecified site: Secondary | ICD-10-CM

## 2024-01-25 DIAGNOSIS — M8588 Other specified disorders of bone density and structure, other site: Secondary | ICD-10-CM | POA: Diagnosis not present

## 2024-02-06 ENCOUNTER — Other Ambulatory Visit: Payer: Self-pay | Admitting: Family Medicine

## 2024-02-06 DIAGNOSIS — Z1231 Encounter for screening mammogram for malignant neoplasm of breast: Secondary | ICD-10-CM

## 2024-02-08 DIAGNOSIS — F331 Major depressive disorder, recurrent, moderate: Secondary | ICD-10-CM | POA: Diagnosis not present

## 2024-02-08 DIAGNOSIS — M8580 Other specified disorders of bone density and structure, unspecified site: Secondary | ICD-10-CM | POA: Diagnosis not present

## 2024-02-08 DIAGNOSIS — I10 Essential (primary) hypertension: Secondary | ICD-10-CM | POA: Diagnosis not present

## 2024-02-08 DIAGNOSIS — F419 Anxiety disorder, unspecified: Secondary | ICD-10-CM | POA: Diagnosis not present

## 2024-02-08 DIAGNOSIS — M160 Bilateral primary osteoarthritis of hip: Secondary | ICD-10-CM | POA: Diagnosis not present

## 2024-02-08 DIAGNOSIS — M47817 Spondylosis without myelopathy or radiculopathy, lumbosacral region: Secondary | ICD-10-CM | POA: Diagnosis not present

## 2024-02-18 DIAGNOSIS — M1711 Unilateral primary osteoarthritis, right knee: Secondary | ICD-10-CM | POA: Diagnosis not present

## 2024-02-26 DIAGNOSIS — M25561 Pain in right knee: Secondary | ICD-10-CM | POA: Diagnosis not present

## 2024-02-26 DIAGNOSIS — M25551 Pain in right hip: Secondary | ICD-10-CM | POA: Diagnosis not present

## 2024-02-26 DIAGNOSIS — M545 Low back pain, unspecified: Secondary | ICD-10-CM | POA: Diagnosis not present

## 2024-02-26 DIAGNOSIS — R262 Difficulty in walking, not elsewhere classified: Secondary | ICD-10-CM | POA: Diagnosis not present

## 2024-02-27 DIAGNOSIS — R262 Difficulty in walking, not elsewhere classified: Secondary | ICD-10-CM | POA: Diagnosis not present

## 2024-02-27 DIAGNOSIS — M25551 Pain in right hip: Secondary | ICD-10-CM | POA: Diagnosis not present

## 2024-02-27 DIAGNOSIS — M25561 Pain in right knee: Secondary | ICD-10-CM | POA: Diagnosis not present

## 2024-02-27 DIAGNOSIS — M545 Low back pain, unspecified: Secondary | ICD-10-CM | POA: Diagnosis not present

## 2024-03-04 DIAGNOSIS — M545 Low back pain, unspecified: Secondary | ICD-10-CM | POA: Diagnosis not present

## 2024-03-04 DIAGNOSIS — M25551 Pain in right hip: Secondary | ICD-10-CM | POA: Diagnosis not present

## 2024-03-04 DIAGNOSIS — M25561 Pain in right knee: Secondary | ICD-10-CM | POA: Diagnosis not present

## 2024-03-04 DIAGNOSIS — R262 Difficulty in walking, not elsewhere classified: Secondary | ICD-10-CM | POA: Diagnosis not present

## 2024-03-06 DIAGNOSIS — R262 Difficulty in walking, not elsewhere classified: Secondary | ICD-10-CM | POA: Diagnosis not present

## 2024-03-06 DIAGNOSIS — M545 Low back pain, unspecified: Secondary | ICD-10-CM | POA: Diagnosis not present

## 2024-03-06 DIAGNOSIS — M25551 Pain in right hip: Secondary | ICD-10-CM | POA: Diagnosis not present

## 2024-03-06 DIAGNOSIS — M25561 Pain in right knee: Secondary | ICD-10-CM | POA: Diagnosis not present

## 2024-03-07 ENCOUNTER — Ambulatory Visit
Admission: RE | Admit: 2024-03-07 | Discharge: 2024-03-07 | Disposition: A | Discharge status: Home / Self Care | Source: Ambulatory Visit | Attending: Family Medicine | Admitting: Family Medicine

## 2024-03-07 DIAGNOSIS — Z08 Encounter for follow-up examination after completed treatment for malignant neoplasm: Secondary | ICD-10-CM | POA: Diagnosis not present

## 2024-03-07 DIAGNOSIS — Z85828 Personal history of other malignant neoplasm of skin: Secondary | ICD-10-CM | POA: Diagnosis not present

## 2024-03-07 DIAGNOSIS — L821 Other seborrheic keratosis: Secondary | ICD-10-CM | POA: Diagnosis not present

## 2024-03-07 DIAGNOSIS — D225 Melanocytic nevi of trunk: Secondary | ICD-10-CM | POA: Diagnosis not present

## 2024-03-07 DIAGNOSIS — Z1231 Encounter for screening mammogram for malignant neoplasm of breast: Secondary | ICD-10-CM

## 2024-03-07 DIAGNOSIS — Z872 Personal history of diseases of the skin and subcutaneous tissue: Secondary | ICD-10-CM | POA: Diagnosis not present

## 2024-03-07 DIAGNOSIS — L814 Other melanin hyperpigmentation: Secondary | ICD-10-CM | POA: Diagnosis not present

## 2024-03-10 DIAGNOSIS — M25561 Pain in right knee: Secondary | ICD-10-CM | POA: Diagnosis not present

## 2024-03-10 DIAGNOSIS — R262 Difficulty in walking, not elsewhere classified: Secondary | ICD-10-CM | POA: Diagnosis not present

## 2024-03-10 DIAGNOSIS — M545 Low back pain, unspecified: Secondary | ICD-10-CM | POA: Diagnosis not present

## 2024-03-10 DIAGNOSIS — M25551 Pain in right hip: Secondary | ICD-10-CM | POA: Diagnosis not present

## 2024-03-12 DIAGNOSIS — R262 Difficulty in walking, not elsewhere classified: Secondary | ICD-10-CM | POA: Diagnosis not present

## 2024-03-12 DIAGNOSIS — M545 Low back pain, unspecified: Secondary | ICD-10-CM | POA: Diagnosis not present

## 2024-03-12 DIAGNOSIS — M25551 Pain in right hip: Secondary | ICD-10-CM | POA: Diagnosis not present

## 2024-03-12 DIAGNOSIS — M25561 Pain in right knee: Secondary | ICD-10-CM | POA: Diagnosis not present

## 2024-03-17 DIAGNOSIS — M1711 Unilateral primary osteoarthritis, right knee: Secondary | ICD-10-CM | POA: Diagnosis not present

## 2024-03-17 DIAGNOSIS — M25561 Pain in right knee: Secondary | ICD-10-CM | POA: Diagnosis not present

## 2024-03-17 DIAGNOSIS — R262 Difficulty in walking, not elsewhere classified: Secondary | ICD-10-CM | POA: Diagnosis not present

## 2024-03-17 DIAGNOSIS — M25551 Pain in right hip: Secondary | ICD-10-CM | POA: Diagnosis not present

## 2024-03-17 DIAGNOSIS — M545 Low back pain, unspecified: Secondary | ICD-10-CM | POA: Diagnosis not present

## 2024-04-22 DIAGNOSIS — M1711 Unilateral primary osteoarthritis, right knee: Secondary | ICD-10-CM | POA: Diagnosis not present

## 2024-05-01 DIAGNOSIS — E7800 Pure hypercholesterolemia, unspecified: Secondary | ICD-10-CM | POA: Diagnosis not present

## 2024-05-01 DIAGNOSIS — R5383 Other fatigue: Secondary | ICD-10-CM | POA: Diagnosis not present

## 2024-05-01 DIAGNOSIS — E538 Deficiency of other specified B group vitamins: Secondary | ICD-10-CM | POA: Diagnosis not present

## 2024-05-01 DIAGNOSIS — E559 Vitamin D deficiency, unspecified: Secondary | ICD-10-CM | POA: Diagnosis not present

## 2024-05-01 DIAGNOSIS — I10 Essential (primary) hypertension: Secondary | ICD-10-CM | POA: Diagnosis not present

## 2024-05-05 ENCOUNTER — Encounter: Payer: Self-pay | Admitting: Podiatry

## 2024-05-05 ENCOUNTER — Ambulatory Visit: Admitting: Podiatry

## 2024-05-05 DIAGNOSIS — M2170 Unequal limb length (acquired), unspecified site: Secondary | ICD-10-CM | POA: Diagnosis not present

## 2024-05-05 DIAGNOSIS — M79671 Pain in right foot: Secondary | ICD-10-CM

## 2024-05-05 NOTE — Progress Notes (Signed)
 Patient presents complaining of a leg length discrepancy that her doctor noted.  She does have some pain with it.  When she is walking she gets some pain around the subtalar joint and laterally she also has some of the hip.  She had a total knee arthroplasty on the left several years ago this seemed to make it worse.  She also has significant osteoarthritis in the lower limbs.   Physical exam:  General appearance: Pleasant, and in no acute distress. AOx3.  Vascular: Pedal pulses: DP 2/4 bilaterally, PT 1/4 bilaterally.  Mild edema lower legs bilaterally. Capillary fill time immediate bilaterally.  Neurological: Light touch intact feet bilaterally.  Normal Achilles reflex bilaterally.  No clonus or spasticity noted.  Dermatologic:   Skin normal temperature bilaterally.  Skin normal color, tone, and texture bilaterally.   Musculoskeletal: Tenderness subtalar joint bilaterally with palpation of the sinus tarsi.  No pain with range of motion subtalar joint or joint.  Ankle and subtalar joint range of motion within normal limits bilaterally.  Radiographic measurements from the superior femoral head to the distal tibial plafond done on December 27, 2023 reveal a 1 cm discrepancy right shorter than the left.  Diagnosis: 1.  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  Pain foot right  Plan: -New patient office visit for evaluation and management level 3. - Discussed with the leg length discrepancy of 16 mm with the right leg being shorter.  Recommend he try a heel lift first just in the shoe.  This will be a lot more flexible than a full-length shoe accommodation.  We can heal it to the have around the JAP for her. -Dispensed quarter inch felt heel lifts for the right.  Told her to use 1 at first for 3 to 4 weeks and see how she does with this if she feels like she needs more to tape the second  to the top of the first 1 and see how that works.  May need to go with a deeper heel cup  shoe.  Return 8 weeks follow-up leg length discrepancy.

## 2024-05-27 DIAGNOSIS — M1711 Unilateral primary osteoarthritis, right knee: Secondary | ICD-10-CM | POA: Diagnosis not present

## 2024-07-02 DIAGNOSIS — F419 Anxiety disorder, unspecified: Secondary | ICD-10-CM | POA: Diagnosis not present

## 2024-07-02 DIAGNOSIS — F331 Major depressive disorder, recurrent, moderate: Secondary | ICD-10-CM | POA: Diagnosis not present

## 2024-07-02 DIAGNOSIS — M2170 Unequal limb length (acquired), unspecified site: Secondary | ICD-10-CM | POA: Diagnosis not present

## 2024-07-02 DIAGNOSIS — Z23 Encounter for immunization: Secondary | ICD-10-CM | POA: Diagnosis not present

## 2024-07-02 DIAGNOSIS — I10 Essential (primary) hypertension: Secondary | ICD-10-CM | POA: Diagnosis not present

## 2024-08-18 ENCOUNTER — Other Ambulatory Visit: Payer: Self-pay | Admitting: Internal Medicine

## 2024-08-21 ENCOUNTER — Ambulatory Visit: Admitting: Internal Medicine

## 2024-08-27 DIAGNOSIS — I10 Essential (primary) hypertension: Secondary | ICD-10-CM | POA: Diagnosis not present

## 2024-08-27 DIAGNOSIS — J069 Acute upper respiratory infection, unspecified: Secondary | ICD-10-CM | POA: Diagnosis not present

## 2024-11-20 ENCOUNTER — Ambulatory Visit: Admitting: Internal Medicine
